# Patient Record
Sex: Male | Born: 1950 | Race: White | Hispanic: No | Marital: Married
Health system: Midwestern US, Community
[De-identification: ages and names within clinical notes are randomized; demographics above are authoritative.]

## PROBLEM LIST (undated history)

## (undated) DIAGNOSIS — D649 Anemia, unspecified: Secondary | ICD-10-CM

## (undated) MED FILL — TAMSULOSIN 0.4 MG CAPSULE: 0.4 0.4 mg | ORAL | 30 days supply | Qty: 30 | Fill #0

## (undated) MED FILL — OXYBUTYNIN CHLORIDE ER 5 MG TABLET,EXTENDED RELEASE 24 HR: 5 5 MG | ORAL | 30 days supply | Qty: 30 | Fill #0

---

## 2009-12-19 LAB — COMPREHENSIVE METABOLIC PANEL
ALT: 26 U/L (ref 10–40)
AST: 19 U/L (ref 15–37)
Albumin/Globulin Ratio: 1.2 (ref 1.1–2.2)
Albumin: 4 g/dL (ref 3.4–5.0)
Alkaline Phosphatase: 158 U/L — ABNORMAL HIGH (ref 45–129)
Anion Gap: 8
BUN: 14 mg/dL (ref 7–18)
CO2: 26 meq/L (ref 21–32)
Calcium: 9.2 mg/dL (ref 8.3–10.6)
Chloride: 101 meq/L (ref 99–110)
Creatinine: 1 mg/dL (ref 0.9–1.3)
GFR Est, African/Amer: >60
GFR, Estimated: >60 (ref >60)
Glucose: 238 mg/dL — ABNORMAL HIGH (ref 70–99)
Potassium: 4.3 meq/L (ref 3.5–5.1)
Sodium: 135 meq/L — ABNORMAL LOW (ref 136–145)
Total Bilirubin: 0.3 mg/dL (ref 0.0–1.0)
Total Protein: 7.3 g/dL (ref 6.4–8.2)

## 2009-12-19 LAB — CBC WITH AUTO DIFFERENTIAL
Basophils %: 0.5 % (ref 0.0–2.0)
Basophils Absolute: 0.1 10*3 (ref 0.0–0.2)
Eosinophils %: 6.4 % — ABNORMAL HIGH (ref 0.0–5.0)
Eosinophils Absolute: 0.9 10*3 — ABNORMAL HIGH (ref 0.0–0.6)
Granulocyte Absolute Count: 8.4 10*3 — ABNORMAL HIGH (ref 1.7–7.7)
Hematocrit: 46.5 % (ref 40.5–52.5)
Hemoglobin: 15.9 g/dL (ref 13.5–17.5)
Lymphocytes %: 27.9 % (ref 25.0–40.0)
Lymphocytes Absolute: 4 10*3 (ref 1.0–5.1)
MCH: 31.7 pg (ref 26–34)
MCHC: 34.1 g/dL (ref 31–36)
MCV: 93 fl (ref 80–100)
MPV: 8.6 fl (ref 5.0–10.5)
Monocytes %: 6.7 %
Monocytes Absolute: 1 10*3
Platelets: 210 10*3 (ref 135–450)
RBC: 5.01 10*6 (ref 4.2–5.9)
RDW: 14.3 % (ref 11.5–14.5)
Segs Relative: 58.5 % (ref 42.0–63.0)
WBC: 14.3 10*3 — ABNORMAL HIGH (ref 4.0–11.0)

## 2009-12-19 LAB — LIPID PANEL
Cholesterol, Total: 129 mg/dl (ref <200)
HDL: 30 mg/dL — ABNORMAL LOW (ref 40–60)
LDL Calculated: 75 mg/dl (ref <100)
Triglycerides: 117 mg/dl (ref <150)
VLDL Cholesterol Calculated: 23 mg/dl

## 2009-12-20 LAB — HEMOGLOBIN A1C
A1c: 9.9 — ABNORMAL HIGH (ref 4.0–6.0)
eAG: 237.4 mg/dl

## 2015-06-22 ENCOUNTER — Ambulatory Visit: Admit: 2015-06-22 | Discharge: 2015-06-22 | Payer: PRIVATE HEALTH INSURANCE

## 2015-06-22 DIAGNOSIS — N35012 Post-traumatic membranous urethral stricture: Secondary | ICD-10-CM

## 2015-06-22 MED ORDER — ciprofloxacin HCl (CIPRO) 500 MG tablet
500 | ORAL_TABLET | Freq: Two times a day (BID) | ORAL | Status: AC
Start: 2015-06-22 — End: 2016-01-03

## 2015-06-22 NOTE — Unmapped (Signed)
Chief Complaint   Patient presents with   ??? 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 Patient with several year history of LUTS. PCP treated for BPH however symptoms persisted. Then referred to [facility]rology who did a cysto and noted urethral stricture. Patient SPT for 6 months. Has it changed monthly.   Has diabetes and COPD.  Smokes 1 pack per day which is down from 4 packs per day for many years.        Review of Systems   Constitutional: Negative for fever, chills and fatigue.   Genitourinary: Positive for difficulty urinating.       Allergies  Review of patient's allergies indicates no known allergies.    Medications  Outpatient Encounter Prescriptions as of 06/22/2015   Medication Sig Dispense Refill   ??? albuterol (PROVENTIL;VENTOLIN;PROAIR) 90 mcg/actuation inhaler Inhale 2 puffs into the lungs every 6 hours as needed for Wheezing.     ??? cholecalciferol, vitamin D3, 1000 units tablet Take 1,000 Units by mouth daily.     ??? docusate sodium (COLACE) 100 MG capsule Take 100 mg by mouth 2 times a day.     ??? DULoxetine (CYMBALTA) 60 MG capsule Take 60 mg by mouth daily.     ??? ergocalciferol (VITAMIN D2) 50,000 unit capsule Take 50,000 Units by mouth once a week.     ??? insulin aspart (NOVOLOG) 100 unit/mL injection Inject subcutaneously 3 times a day with meals.     ??? insulin glargine (LANTUS) 100 unit/mL injection Inject subcutaneously at bedtime.     ??? liraglutide (VICTOZA 3-PAK) 0.6 mg/0.1 mL (18 mg/3 mL) PnIj Inject subcutaneously.     ??? lisinopril-hydrochlorothiazide (PRINZIDE,ZESTORETIC) 20-12.5 mg per tablet Take 1 tablet by mouth daily.     ??? loratadine (CLARITIN) 10 mg tablet Take 10 mg by mouth daily.     ??? metFORMIN (GLUCOPHAGE) 1000 MG tablet Take 1,000 mg by mouth 2 times a day with meals.     ??? mirtazapine (REMERON) 30 MG tablet Take 30 mg by mouth at bedtime.     ??? ranitidine (ZANTAC) 150 MG tablet  Take 150 mg by mouth 2 times a day.     ??? roPINIRole (REQUIP) 1 MG tablet Take 1 mg by mouth 3 times a day.     ??? rosuvastatin (CRESTOR) 40 MG tablet Take 40 mg by mouth daily.     ??? sildenafil (VIAGRA) 100 MG tablet Take 100 mg by mouth daily as needed for Erectile Dysfunction.     ??? tiotropium (SPIRIVA) 18 mcg Inhale 18 mcg into the lungs daily.     ??? traZODone (DESYREL) 100 MG tablet Take 100 mg by mouth at bedtime.     ??? zolpidem (AMBIEN) 10 mg tablet Take 10 mg by mouth at bedtime as needed for Sleep.       No facility-administered encounter medications on file as of 06/22/2015.        Histories  He has a past medical history of Urinary retention; COPD (chronic obstructive pulmonary disease); Diabetes mellitus; Hypertension; and FH: allergy.    He has past surgical history that includes Hernia repair; Tendon repair; and Rotator cuff repair.    His family history is not on file.    He reports that he has been smoking.  He does not have any smokeless tobacco history on file. He reports  that he does not drink alcohol or use illicit drugs.    The following portions of the patient's history were reviewed and updated as appropriate: allergies, current medications, past family history, past medical history, past social history, past surgical history and problem list.    Blood pressure 140/72, pulse 79, temperature 97.1 ??F (36.2 ??C), temperature source Oral, resp. rate 16, height 6' 2 (1.88 m), weight 234 lb (106.142 kg).  Physical Exam   Constitutional: He is oriented to person, place, and time. He appears well-developed and well-nourished.   HENT:   Head: Normocephalic and atraumatic.   Eyes: Conjunctivae and EOM are normal.   Neck: Normal range of motion. Neck supple.   Pulmonary/Chest: Effort normal. No respiratory distress.   Musculoskeletal: Normal range of motion.   Neurological: He is alert and oriented to person, place, and time.   Skin: Skin is warm and dry.   Psychiatric: He has a normal mood and affect. His  behavior is normal.            Assessment  ??Urethral stricture: No imaging available  Urinary retention managed with SPT x 7 months  Reports hx of sneezing with full bladder 30 years ago and seeing blood afterwards. Had cysto and was told small tear in the bladder    Plan  Cystoscopy, retrograde urethrogram, antegrade urethrogram   If small bulbar stricture, can perform one time dilation  If long stricture will need to discuss formal recontruction       Medical Decision Making  The following items were considered in medical decision making:  Review / order other diagnostic tests/interventions     I performed a history and physical exam of the patient and discussed patient management with the fellow I reviewed the fellow's note and agree with the documented findings and plan of care.

## 2015-07-19 ENCOUNTER — Ambulatory Visit: Admit: 2015-07-19 | Payer: PRIVATE HEALTH INSURANCE | Attending: Family

## 2015-07-19 DIAGNOSIS — N359 Urethral stricture, unspecified: Secondary | ICD-10-CM

## 2015-07-19 LAB — BASIC METABOLIC PANEL
Anion Gap: 7 mmol/L (ref 3–16)
BUN: 24 mg/dL (ref 7–25)
CO2: 28 mmol/L (ref 21–33)
Calcium: 8.9 mg/dL (ref 8.6–10.3)
Chloride: 103 mmol/L (ref 98–110)
Creatinine: 0.74 mg/dL (ref 0.60–1.30)
Glucose: 223 mg/dL — ABNORMAL HIGH (ref 70–100)
Osmolality, Calculated: 297 mosm/kg (ref 278–305)
Potassium: 4 mmol/L (ref 3.5–5.3)
Sodium: 138 mmol/L (ref 133–146)
eGFR AA CKD-EPI: >90 See note.
eGFR NONAA CKD-EPI: >90 See note.

## 2015-07-19 LAB — CBC
Hematocrit: 45.5 % (ref 38.5–50.0)
Hemoglobin: 15.2 g/dL (ref 13.2–17.1)
MCH: 30 pg (ref 27.0–33.0)
MCHC: 33.3 g/dL (ref 32.0–36.0)
MCV: 90.1 fL (ref 80.0–100.0)
MPV: 8.7 fL (ref 7.5–11.5)
Platelets: 216 10*3/uL (ref 140–400)
RBC: 5.06 10*6/uL (ref 4.20–5.80)
RDW: 14.3 % (ref 11.0–15.0)
WBC: 12 10*3/uL (ref 3.8–10.8)

## 2015-07-19 NOTE — Unmapped (Signed)
ANESTHESIOLOGY CONSULTATION AND PRE-OPERATIVE HISTORY AND PHYSICAL       Subjective:      [facility] Attending Physician: Marciano Sequin, MD  [facility] NP / PA:   Sharen Heck, CNP    Date of Surgery:  08/01/2015  Surgeon:  Dr. Ree Shay   Diagnosis:  Urethral stricture  Procedure:  Cystoscopy, retrograde urethrogram, antegrade urethrogram, possible urethral dilation    Patient ID: Garrett Reynolds is a 64 y.o. male.    Patient is being seen today at the request of Dr. Ree Shay to render an opinion on perioperative risk optimization and to coordinate medical care as necessary prior to the following procedure:  Cystoscopy, retrograde urethrogram, antegrade urethrogram, possible urethral dilation.    Chief Complaint   Patient presents with   ??? Pre-op Exam     urethral stricture        History of Present Illness:    64 yo male with a several year history of LUTS. PCP treated for BPH but symptoms persisted. A cystoscopy was performed at the [facility] revealing a urethral stricture. His urinary retention is managed with a suprapubic catheter. He denies pain, rating pain 0/10 in severity. Denies aggravating or alleviating factors. Denies associated symptoms. He is now scheduled for cystoscopy, retrograde urethrogram, antegrade urethrogram, possible urethral dilation    Chronic Medical Conditions, Severity, Optimization:      Duke Activity Scale:  3 - Walking on a flat surface for one or two blocks.           Medical History:     Past Medical History   Diagnosis Date   ??? Urinary retention    ??? COPD (chronic obstructive pulmonary disease)    ??? Diabetes mellitus    ??? Hypertension    ??? FH: allergy    ??? High cholesterol    ??? Stroke    ??? GERD (gastroesophageal reflux disease)        Surgical History:     Past Surgical History   Procedure Laterality Date   ??? Hernia repair     ??? Tendon repair     ??? Rotator cuff repair         Family History:   History reviewed. No pertinent family history.    Social History:     Social History     Social History   ??? Marital  Status: Married     Spouse Name: N/A   ??? Number of Children: N/A   ??? Years of Education: N/A     Occupational History   ??? Not on file.     Social History Main Topics   ??? Smoking status: Current Every Day Smoker -- 1.00 packs/day     Types: Cigarettes   ??? Smokeless tobacco: Not on file   ??? Alcohol Use: Yes      Comment: rare   ??? Drug Use: No   ??? Sexual Activity: Not on file     Other Topics Concern   ??? Caffeine Use Yes   ??? Exercise No   ??? Seat Belt Yes     Social History Narrative       Allergies:   No Known Allergies    Medications:     Prior to Admission medications taking for visit date 07/19/15   Medication Sig Taking? Authorizing Provider   albuterol (PROVENTIL;VENTOLIN;PROAIR) 90 mcg/actuation inhaler Inhale 2 puffs into the lungs every 6 hours as needed for Wheezing. Yes Historical Provider, MD   cholecalciferol, vitamin D3, 1000 units tablet Take  1,000 Units by mouth daily. Yes Historical Provider, MD   docusate sodium (COLACE) 100 MG capsule Take 100 mg by mouth 2 times a day. Yes Historical Provider, MD   DULoxetine (CYMBALTA) 60 MG capsule Take 60 mg by mouth daily. Yes Historical Provider, MD   insulin aspart (NOVOLOG) 100 unit/mL injection Inject subcutaneously 2 times a day.    Yes Historical Provider, MD   insulin glargine (LANTUS) 100 unit/mL injection Inject subcutaneously at bedtime. Yes Historical Provider, MD   liraglutide (VICTOZA 3-PAK) 0.6 mg/0.1 mL (18 mg/3 mL) PnIj Inject subcutaneously. Yes Historical Provider, MD   lisinopril-hydrochlorothiazide (PRINZIDE,ZESTORETIC) 20-12.5 mg per tablet Take 1 tablet by mouth daily. Yes Historical Provider, MD   loratadine (CLARITIN) 10 mg tablet Take 10 mg by mouth daily. Yes Historical Provider, MD   metFORMIN (GLUCOPHAGE) 1000 MG tablet Take 1,000 mg by mouth 2 times a day with meals. Yes Historical Provider, MD   mirtazapine (REMERON) 30 MG tablet Take 30 mg by mouth at bedtime. Yes Historical Provider, MD   ranitidine (ZANTAC) 150 MG tablet Take 150  mg by mouth 2 times a day. Yes Historical Provider, MD   roPINIRole (REQUIP) 1 MG tablet Take 1 mg by mouth 3 times a day. Yes Historical Provider, MD   rosuvastatin (CRESTOR) 40 MG tablet Take 40 mg by mouth daily. Yes Historical Provider, MD   sildenafil (VIAGRA) 100 MG tablet Take 100 mg by mouth daily as needed for Erectile Dysfunction. Yes Historical Provider, MD   tiotropium (SPIRIVA) 18 mcg Inhale 18 mcg into the lungs daily. Yes Historical Provider, MD   traZODone (DESYREL) 100 MG tablet Take 100 mg by mouth at bedtime. Yes Historical Provider, MD   zolpidem (AMBIEN) 10 mg tablet Take 10 mg by mouth at bedtime as needed for Sleep. Yes Historical Provider, MD   ciprofloxacin HCl (CIPRO) 500 MG tablet Take 1 tablet (500 mg total) by mouth 2 times a day. Start 7 days prior to surgery  [place]   ergocalciferol (VITAMIN D2) 50,000 unit capsule Take 50,000 Units by mouth once a week.  Historical Provider, MD          Review of Systems   Constitutional: Negative for fever, chills, diaphoresis, activity change, appetite change and fatigue.   HENT: Negative for ear pain, hearing loss, sore throat, tinnitus and trouble swallowing.    Eyes: Positive for visual disturbance. Negative for pain, discharge, redness and itching.   Respiratory: Positive for cough, shortness of breath and wheezing. Negative for apnea, choking and stridor.         SOB only when doing yard work   Cardiovascular: Negative for chest pain, palpitations and leg swelling.        Denies orthopnea   Gastrointestinal: Positive for heartburn. Negative for nausea, vomiting, abdominal pain, diarrhea, constipation and abdominal distention.   Genitourinary:        Suprapubic catheter    Musculoskeletal: Positive for gait problem. Negative for back pain, joint swelling, neck pain and neck stiffness.        Bilateral ankle pain   Skin: Negative for color change, pallor, rash and wound.   Neurological: Negative for dizziness, tremors, seizures,  syncope, facial asymmetry, speech difficulty, weakness, light-headedness, numbness and headaches.   Hematological: Does not bruise/bleed easily.   Psychiatric/Behavioral: Positive for depression and sleep disturbance. Negative for suicidal ideas, confusion and self-injury. The patient is nervous/anxious.        Objective:   Blood pressure 110/64, pulse  94, temperature 98.3 ??F (36.8 ??C), temperature source Oral, height 6' 2 (1.88 m), weight 235 lb (106.595 kg), SpO2 94 %.    Physical Exam   Constitutional: He is oriented to person, place, and time. He appears well-developed and well-nourished. No distress.   HENT:   Head: Normocephalic and atraumatic.   Right Ear: Hearing and external ear normal.   Left Ear: Hearing and external ear normal.   Nose: Nose normal.   Mouth/Throat: Uvula is midline, oropharynx is clear and moist and mucous membranes are normal.   Full upper dental extractions, multiple missing lower teeth   Eyes: Conjunctivae, EOM and lids are normal. Pupils are equal, round, and reactive to light. No scleral icterus.   Neck: Normal range of motion. Neck supple. No JVD present. Carotid bruit is not present. No tracheal deviation present. No thyroid mass and no thyromegaly present.   Cardiovascular: Normal rate, regular rhythm, S1 normal, S2 normal and normal heart sounds.  Exam reveals no gallop and no friction rub.    No murmur heard.  Pulses:       Carotid pulses are 2+ on the right side, and 2+ on the left side.       Radial pulses are 2+ on the right side, and 2+ on the left side.        Dorsalis pedis pulses are 2+ on the right side, and 2+ on the left side.   Pulmonary/Chest: Effort normal. No stridor. No apnea. No respiratory distress. He has decreased breath sounds. He has wheezes in the right lower field and the left lower field. He has no rhonchi. He has no rales. He exhibits no tenderness.   Abdominal: Soft. Bowel sounds are normal. He exhibits no distension and no mass. There is no  tenderness. There is no rebound and no guarding.       Musculoskeletal: Normal range of motion. He exhibits edema. He exhibits no tenderness.   Trace edema   Lymphadenopathy:     He has no cervical adenopathy.   Neurological: He is alert and oriented to person, place, and time. He has normal strength. He displays no tremor. No cranial nerve deficit or sensory deficit. He exhibits normal muscle tone. Coordination and gait normal.   Skin: Skin is warm and dry. No petechiae, no purpura and no rash noted. He is not diaphoretic. No cyanosis or erythema. No pallor. Nails show no clubbing.   Psychiatric: He has a normal mood and affect. His speech is normal and behavior is normal. Judgment and thought content normal.       Airway:  Mallampati I (soft palate, uvula, fauces, and tonsillar pillars visible), Thyromental distance 3 finger breadths, opening 3 finger breadths. Full upper dental extraction, multiple missing lower teeth. Denies loose teeth.  Full neck ROM. Large amount of facial hair.        Lab Review:   No results found for: WBC, HGB, HCT, MCH, PLT, GLUCOSE, CREATININE, NA, K, CL, CO2, PREALBUMIN, GFRNONAFRAM, LDH, BILITOT, PROT, AST, ALT, ALKPHOS, CHOLTOT, LDL, HDL, TRIG, PROTIME      Study Results:     ECHO 2008  Normal LV function  Bubble study normal without shunt  No regional wall motion abnormalities    Stress 2007  Normal study  Negative for ischemia    ASA Physical Status:   ASA Physical Status:  3       Assessment and Recommendations:   64 yo male with a urethral stricture for cystoscopy, retrograde urethrogram,  antegrade urethrogram, possible urethral dilation under GA.    Concurrent medical issues include:    1. Cardiac risk/Functional status: No known CAD or CHF. Stress test from 2007 was negative for ischemia and ECHO from 2008 showed a normal EF and no regional wall motion abnormalities. Denies chest pain. Denies orthopnea or edema. SOB only when doing yard work or walking at a fast pace. He is able  to climb a flight of stairs slowly and walked around the hospital today without chest pain or SOB. Duke Activity 3. Will not require any further workup for this lower risk surgery.    2. Hypertension: BP today 110/64.  Taking lisinopril-HCTZ.  Recommended to hold AM of OR.     3. HLD: taking rosuvastatin.  Recommended to continue perioperatively.     4. Diabetes Type II: Hgb A1C 6.7 from labs on 04/2015. Average fasting glucose 110. Taking metformin. He is also taking victoza, novolog sliding scale BID, and lantus BID.  Instructed to half lantus dose night prior to surgery. He will hold his metformin, victoza, novolog, and lantus AM of OR.     5. GERD: controlled with zantac.  Recommended to take AM of OR.     6. COPD: controlled with multiple inhalers. Denies need for supplemental Oxygen or recent exacerbation.  Managed by his PCP. C/o chronic cough and wheezing. SpO2 95% on room air. Recommended to use and bring inhalers AM of OR.     7. Depression: taking cymbalta and trazodone. Recommended to continue perioperatively.    8. TIA: occurred in 2008. Denies residual weakness or memory loss.     9. Tobacco Use: he is down from 4 packs daily to 1. Smoking cessation discussed but he is not ready to quit. C/o chronic cough and wheezing.     10. CKD Stage III: Cr. 1.37 and GFR 52 from labs in 02/2015.     Preoperative instructions reviewed with patient. Patient verbalizes understanding.  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  Unsure of exact cause, states  They told me I quit breathing. Has had surgical procedures since with no complications. Airway does not predict difficult management. Peripheral veins appear adequate for access.    Anesthetic Type: Recommend GA  Analgesia Type: Multimodal analgesia including IV opiates and other IV/PO analgesics   Venous Access: Adequate peripheral IVs     Monitors: Standard ASA monitors  Postoperative disposition: PACU    Discussed with patient the risks, benefits, and potential complications associated with anesthesia including, but not limited to, stroke, MI, and death. Patient verbalized understanding and agreed to the plan as outlined above.    Ester Rink, MD  PGY-2 Anesthesiology    ATTENDING ANESTHESIOLOGIST ADDENDUM:  Sharen Heck CNP, Ester Rink, MD and I interviewed and examined Garrett Reynolds and discussed their care.  I reviewed, confirmed, and amended the above note and agree with the findings and plan of care as noted.        I have conveyed my recommendations for perioperative management of this patient???s medical problems through the shared electronic medical record.    Marciano Sequin, M.D.

## 2015-07-19 NOTE — Unmapped (Addendum)
Pre-Procedure Instructions    We???re pleased that you have chosen [facility] for your upcoming procedure.  The staff serving you is professionally trained to provide the highest quality care.  We encourage you to ask questions and to let the staff know your special needs.  We want your visit to be as comfortable as possible.    Your procedure is scheduled on 08/01/2015 at 1130 AM.  Please arrive at 930 AM at: [facility] on the second floor of the main hospital     ??? DO NOT EAT OR DRINK ANYTHING (including gum, mints, water, etc.) after midnight the night before your procedure.  You may brush your teeth and gargle on the morning of surgery, but do not swallow any water.  You may take the following medications with a small sip of water:    1. Please use and bring inhalers  ??? Please make transportation arrangements and bring a responsible adult to accompany you home and remain with you for 24 hours.  ??? Leave valuables (money, jewelry, credit cards) at home.  If you wear glasses or contacts, bring a case for safekeeping.  ??? Wear casual, loose fitting, and comfortable clothing.  A gown will be provided.  If you are staying overnight, bring a small overnight bag.  (Storage space is limited.)  ??? Please remove all makeup, jewelry, body piercings, powder, perfume, and nail polish before you arrive.  ??? Bring a list of your medications and dose including herbal and over-the-counter medications.  Do not bring any pills or medications to the hospital. (Exception: transplant patients.)  ??? Bring a photo ID and your insurance card so we can bill your insurance company directly.  ??? Please do not bring any children under the age of 73 to the hospital.  ??? Do not shave in the area of the surgery for 2 days prior to surgery.  If needed, a trained staff member will clip the area immediately before your surgery.  ??? Quit smoking as far in advance of surgery as possible.  Patients who quit at least 30 days before  surgery may have better outcomes.  ??? If you are diabetic, pay close attention to your blood sugar and try to keep it in the range your doctor wants it to be in.  If you have low blood sugar prior to coming in to the hospital you may drink a small amount of CLEAR SUGAR-CONTAINING FLUIDS WITHOUT PULP SUCH AS APPLE JUICE OR CLEAR SODA.  Depending on the procedure you are having this could impact the timing of your surgery.  ??? Discuss discontinuing herbal medications with your doctor before surgery.  ??? Please shower at home the evening before and the morning of surgery using an antiseptic agent, if provided, or antibacterial soap.  ??? If you suspect that you have an infection prior to surgery, contact your doctor and surgeon prior to surgery.  Also tell the pre-surgery nurse on the day of surgery.    Additional instructions: No aleve, naproxen, advil, meloxicam, voltaren, or ibuprofen 7 days prior to surgery. Please stop supplements 7 days prior to surgery.     Please half your lantus dose night prior to surgery.    Contact information:  [facility], Monday - Friday 8:30 am - 5:00 pm   [phone]  [facility], - Friday 8:30 am - 5:00 pm   [phone]

## 2015-07-31 MED FILL — CEFAZOLIN 2 GRAM/50 ML IN DEXTROSE (ISO-OSMOTIC) INTRAVENOUS PIGGYBACK: 2 2 gram/50 mL | INTRAVENOUS | Qty: 50

## 2015-08-01 ENCOUNTER — Ambulatory Visit: Admit: 2015-08-01 | Payer: PRIVATE HEALTH INSURANCE

## 2015-08-01 LAB — POC GLU MONITORING DEVICE
POC Glucose Monitoring Device: 175 mg/dL (ref 70–100)
POC Glucose Monitoring Device: 140 mg/dL — ABNORMAL HIGH (ref 70–100)

## 2015-08-01 MED ORDER — ipratropium-albuterol (DUO-NEB) 0.5 mg-3 mg(2.5 mg base)/3 mL nebulizer solution 3 mL
0.5 | Freq: Once | RESPIRATORY_TRACT | Status: AC
Start: 2015-08-01 — End: 2015-08-01
  Administered 2015-08-01: 14:00:00 3 mL via RESPIRATORY_TRACT

## 2015-08-01 MED ORDER — propofol 10 mg/ml (DIPRIVAN) INFUSION 200 mg
10 | INTRAVENOUS | Status: AC | PRN
Start: 2015-08-01 — End: 2015-08-01
  Administered 2015-08-01: 15:00:00 150 mg via INTRAVENOUS
  Administered 2015-08-01: 15:00:00 80 mg via INTRAVENOUS

## 2015-08-01 MED ORDER — lidocaine (PF) 20 mg/mL (2 %) Soln
20 | INTRAVENOUS | Status: AC | PRN
Start: 2015-08-01 — End: 2015-08-01
  Administered 2015-08-01: 15:00:00 80 via INTRAVENOUS

## 2015-08-01 MED ORDER — sodium chloride, irrigation 0.9 % irrigation
0.9 | Status: AC | PRN
Start: 2015-08-01 — End: 2015-08-01
  Administered 2015-08-01: 15:00:00 1500

## 2015-08-01 MED ORDER — peppermint oil liquid 1 mL
Status: AC | PRN
Start: 2015-08-01 — End: 2015-08-01

## 2015-08-01 MED ORDER — promethazine (PHENERGAN) injection 6.25 mg
25 | Freq: Four times a day (QID) | INTRAMUSCULAR | Status: AC | PRN
Start: 2015-08-01 — End: 2015-08-01

## 2015-08-01 MED ORDER — nalOXone (NARCAN) injection 0.04 mg
0.4 | INTRAMUSCULAR | Status: AC | PRN
Start: 2015-08-01 — End: 2015-08-01

## 2015-08-01 MED ORDER — OMNIPAQUE (iohexol) 180 mg iodine/mL Soln
180 | INTRATHECAL | Status: AC | PRN
Start: 2015-08-01 — End: 2015-08-01
  Administered 2015-08-01: 15:00:00 15 via INTRATHECAL

## 2015-08-01 MED ORDER — ondansetron (ZOFRAN) 4 mg/2 mL injection
4 | INTRAMUSCULAR | Status: AC | PRN
Start: 2015-08-01 — End: 2015-08-01
  Administered 2015-08-01: 15:00:00 4 via INTRAVENOUS

## 2015-08-01 MED ORDER — oxyCODONE (ROXICODONE) 5 MG immediate release tablet
5 | ORAL_TABLET | ORAL | 0.00 refills | 6.00000 days | Status: AC | PRN
Start: 2015-08-01 — End: 2016-01-03

## 2015-08-01 MED ORDER — HYDROmorphone (DILAUDID) injection Syrg 0.4 mg
1 | INTRAMUSCULAR | Status: AC | PRN
Start: 2015-08-01 — End: 2015-08-01

## 2015-08-01 MED ORDER — propofol 10 mg/ml (DIPRIVAN) injection
10 | INTRAVENOUS | Status: AC | PRN
Start: 2015-08-01 — End: 2015-08-01
  Administered 2015-08-01: 15:00:00 200 via INTRAVENOUS

## 2015-08-01 MED ORDER — fentaNYL (SUBLIMAZE) injection 12.5 mcg
50 | INTRAMUSCULAR | Status: AC | PRN
Start: 2015-08-01 — End: 2015-08-01

## 2015-08-01 MED ORDER — HYDROmorphone (DILAUDID) injection Syrg 0.2 mg
1 | INTRAMUSCULAR | Status: AC | PRN
Start: 2015-08-01 — End: 2015-08-01

## 2015-08-01 MED ORDER — lactatedringersinfusion
INTRAVENOUS | Status: AC | PRN
Start: 2015-08-01 — End: 2015-08-01
  Administered 2015-08-01: 15:00:00 via INTRAVENOUS

## 2015-08-01 MED ORDER — ipratropium-albuterol (DUO-NEB) 0.5 mg-3 mg(2.5 mg base)/3 mL nebulizer solution 3 mL
0.5 | Freq: Once | RESPIRATORY_TRACT | Status: AC
Start: 2015-08-01 — End: 2015-08-01

## 2015-08-01 MED ORDER — fentaNYL (SUBLIMAZE) injection
50 | INTRAMUSCULAR | Status: AC | PRN
Start: 2015-08-01 — End: 2015-08-01
  Administered 2015-08-01 (×2): 50 via INTRAVENOUS

## 2015-08-01 MED ORDER — fentaNYL (SUBLIMAZE) injection 25 mcg
50 | INTRAMUSCULAR | Status: AC | PRN
Start: 2015-08-01 — End: 2015-08-01

## 2015-08-01 MED ORDER — polyethylene glycol (GLYCOLAX) 17 gram/dose powder
17 | Freq: Two times a day (BID) | ORAL | 0.00 refills | 14.00000 days | Status: AC | PRN
Start: 2015-08-01 — End: ?

## 2015-08-01 MED ORDER — fentaNYL (SUBLIMAZE) injection 50 mcg
50 | INTRAMUSCULAR | Status: AC | PRN
Start: 2015-08-01 — End: 2015-08-01

## 2015-08-01 MED ORDER — oxyCODONE (ROXICODONE) immediate release tablet 5 mg
5 | ORAL | Status: AC | PRN
Start: 2015-08-01 — End: 2015-08-01

## 2015-08-01 MED ORDER — ondansetron (ZOFRAN) 4 mg/2 mL injection 4 mg
4 | Freq: Four times a day (QID) | INTRAMUSCULAR | Status: AC | PRN
Start: 2015-08-01 — End: 2015-08-01

## 2015-08-01 MED ORDER — ceFAZolin (ANCEF) IVPB 2 g in D5W (duplex)
2 | INTRAVENOUS | Status: AC | PRN
Start: 2015-08-01 — End: 2015-08-01
  Administered 2015-08-01: 15:00:00 2 g via INTRAVENOUS

## 2015-08-01 MED ORDER — HYDROmorphone (DILAUDID) injection Syrg 0.6 mg
1 | INTRAMUSCULAR | Status: AC | PRN
Start: 2015-08-01 — End: 2015-08-01

## 2015-08-01 MED ORDER — phenylephrine (NEO-SYNEPHRINE) injection
10 | INTRAMUSCULAR | Status: AC | PRN
Start: 2015-08-01 — End: 2015-08-01
  Administered 2015-08-01 (×4): 100 via INTRAVENOUS

## 2015-08-01 MED ORDER — oxyCODONE (ROXICODONE) immediate release tablet 10 mg
5 | ORAL | Status: AC | PRN
Start: 2015-08-01 — End: 2015-08-01
  Administered 2015-08-01: 16:00:00 10 mg via ORAL

## 2015-08-01 MED ORDER — oxyCODONE-acetaminophen (PERCOCET) 5-325 mg per tablet 2 tablet
5-325 | Freq: Once | ORAL | Status: AC
Start: 2015-08-01 — End: 2015-08-01
  Administered 2015-08-01: 14:00:00 2 via ORAL

## 2015-08-01 MED FILL — IPRATROPIUM 0.5 MG-ALBUTEROL 3 MG (2.5 MG BASE)/3 ML NEBULIZATION SOLN: 0.5 0.5 mg-3 mg(2.5 mg base)/3 mL | RESPIRATORY_TRACT | Qty: 3

## 2015-08-01 MED FILL — CEFAZOLIN 2 GRAM/50 ML IN DEXTROSE (ISO-OSMOTIC) INTRAVENOUS PIGGYBACK: 2 2 gram/50 mL | INTRAVENOUS | Qty: 50

## 2015-08-01 MED FILL — OXYCODONE 5 MG TABLET: 5 5 MG | ORAL | Qty: 2

## 2015-08-01 MED FILL — OXYCODONE-ACETAMINOPHEN 5 MG-325 MG TABLET: 5-325 5-325 mg | ORAL | Qty: 2

## 2015-08-01 NOTE — Unmapped (Signed)
Patient alert and oriented x4, states pain is controlled and denies nausea. Vital signs WNL, tolerating PO fluids and crackers. Patient urinated 150 ml per urinal with no difficulties. Discharge instructions reviewed with patient and spouse, prescriptions given. Peripheral IV removed.

## 2015-08-01 NOTE — Unmapped (Signed)
Anesthesia Post Note    Patient: Garrett Reynolds    Procedure(s) Performed: Procedure(s):  CYSTOSCOPY, RETROGRADE URETHROGRAM, ANTEGRADE URETHROGRAM, POSSIBLE URETHRAL DILATION    Anesthesia type: general    Patient location: PACU    Post pain: Adequate analgesia    Post assessment: no apparent anesthetic complications and tolerated procedure well    Last Vitals:   Filed Vitals:    08/01/15 1214   BP: 128/65   Pulse: 80   Temp: 97.5 ??F (36.4 ??C)   Resp: 14   SpO2: 98%       Post vital signs: stable    Level of consciousness: awake, alert  and oriented    Complications: None

## 2015-08-01 NOTE — Unmapped (Signed)
Anesthesia Transfer of Care Note    Patient: Garrett Reynolds  Procedure(s) Performed: Procedure(s):  CYSTOSCOPY, RETROGRADE URETHROGRAM, ANTEGRADE URETHROGRAM, POSSIBLE URETHRAL DILATION    Patient location: PACU    Post pain: Adequate analgesia    Post assessment: no apparent anesthetic complications and tolerated procedure well    Post vital signs:    Filed Vitals:    08/01/15 1136   BP: 130/65   Pulse: 85   Temp: 97.8 ??F (36.6 ??C)   Resp: 16   SpO2: 99%       Level of consciousness: awake and alert     Complications: None

## 2015-08-01 NOTE — Unmapped (Signed)
CYSTOSCOPY, RETROGRADE URETHROGRAM, ANTEGRADE URETHROGRAM, POSSIBLE URETHRAL DILATION  Procedure Note    Geoffery Aultman  08/01/2015      Pre-op Diagnosis: Urethral stricture, unspecified location, unspecified stricture type [N35.9]       Post-op Diagnosis: Bulbar urethral stricture, 1 cm    Procedure(s):  1. Cystoscopy  2. Antegrade urethrogram  3. Retrograde urethrogram  4. Dilation of bulbar urethral stricture, using Filiforms/follower, to 24 F stricture        Surgeon(s):  Tana Felts, MD PHD    Anesthesia: General    Staff:   Circulator: Lequita Halt, RN  Scrub Person: Bayard Hugger, CST  Resident: Payton Mccallum, MD; Onalee Hua, MD    Estimated Blood Loss: Minimal                 Specimens: * No specimens in log *           Drains:             There were no complications unless listed below.  Patient will be discharged from OR with suprapubic tube that is capped.  Will follow up on Friday with Maryjean Ka, post-op check and void trial, and at that time if low PVR will discontinue SPT.  Patient will then need a repeat cystoscopy, antegrade/retrograde urethrogram in 3 months.      Karlena Luebke Myra Rude     Date: 08/01/2015  Time: 11:26 AM

## 2015-08-01 NOTE — Unmapped (Signed)
OPERATIVE REPORT    Date of Operation: 08/01/2015    Preoperative Diagnosis:   1. Urethral stricture, unspecified location    Postoperative Diagnosis:   1. 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 Cystoscopy  2. Antegrade urethrogram  3. Retrograde urethrogram  4. Dilation of bulbar urethral stricture with filiforms and followers, to 24 F     Surgeon:   Tana Felts, MD PHD    Assistants:   Onalee Hua, MD  Payton Mccallum, MD    Anesthesia: General LMA anesthesia    Indications: Garrett Reynolds is a 64 y.o. male with a urethral stricture who was transferred from [place]. Informed consent was obtained and the risks, benefits, and details of the procedure were explained to the patient who elected to proceed.    Details of Procedure:  The patient was brought to the operating room and placed in the dorsal lithotomy position on the operating room table. SCDs were placed on the lower extremities. Following induction of general anesthesia the patient was intubated without issue. The patient's suprapubic tube and then he was prepped and draped in the usual sterile fashion. A routine timeout was performed, confirming the patient, procedure, site, risk of fire, patient allergies and confirming that preoperative antibiotics had been administered prior to incision.      Next we performed a retrograde urethrogram by instilling approximately 30 cc diluted contrast into the urethra.  A small amount of contrast made it to the bladder but there was obvious narrowing at the proximal bulbar urethra.  Then we passed a cystoscope through the suprapubic tube tract and into the bladder.  After identifying the bladder neck, we advance the scope through the prostatic urethra to the level of the verumontanum.  Contrast was then injected simultaneously through the cystoscope from antegrade and retrograde via a catheter tip syringe.  There was an approximately 1 cm segment of bulbar urethral stricture.      Next we  proceeded to dilate the stricture.  Using filiforms and followers and beginning at 58 French, we then dilated the stricture to a size of 24 F.  After the dilation a picture was taken of the dilated urethra using the cystoscope, and the picture is below at the end of the op note.    We then replaced a new suprapubic catheter and instilled it with 10 cc of water.     At the end of the procedure all needle, lap, instrument and sponge counts were correct. The patient tolerated the procedure well, was extubated without issue in the operating room, and was transported to the PACU in stable condition.    Dr. Tana Felts, MD PHD, the attending surgeon, was present for the critical parts of the procedure and directed all clinical decision making.    Findings:   1. There was a proximal bulbar urethral stricture measuring approximately 1 cm. This was dilated to 40 F using filiforms and followers.     Complications: None     Estimated Blood Loss: Minimal    Drains: 16 F suprapubic tube          Specimens: None    Disposition:     Patient will be discharged from OR with suprapubic tube that is capped.  Will follow up on Friday with Garrett Reynolds, post-op check and void trial, and at that time if low PVR will discontinue SPT.  Patient will then need a repeat office cystoscopy in 3 months.  Garrett Myra Rude, MD    I was present for the entire procedure and findings were discussed and agreed with resident as documented above.  Garrett Reynolds

## 2015-08-01 NOTE — Unmapped (Addendum)
Activity:    Activity as tolerated, no limitations, walk or exercise daily  Your suprapubic tube is capped, and this will give you a chance to void normally through your urethra.  If you are unable to urinate, please uncap your suprapubic tube.    Return to work:   May return to work as tolerated    Diet:   Resume home diet, drink plenty of water    Other Instructions:   Take medications as prescribed. No driving while taking narcotic pain medications.    Some blood in urine can be expected after procedure    Go to the ER or call the Urology Resident on call at [phone] if you experience any of the following:  - Worsening pain, nausea, or vomiting not relieved by medications  - Temperature greater than 101.5 F or fever/chills  - Chest pain, shortness of breath, persistent dizziness, swelling in one or both legs  - Inability to urinate    Follow up:  1. Will have follow up on Friday with Garrett Reynolds for a void trial.  If you are voiding well at that time, will discontinue suprapubic tube.  2. Will have repeat cystoscopy and retrograde/antegrade pyelogram in 3 months.    Sedation or General Anesthesia, Adult  Care After  Refer to this sheet in the next 24 hours. These instructions provide you with information on caring for yourself after your procedure. Your caregiver may also give you more specific instructions. Your treatment has been planned according to current medical practices, but problems sometimes occur. Call your caregiver if you have any problems or questions after your procedure.   HOME CARE INSTRUCTIONS   ?? Do not participate in any activities that require you to be alert or coordinated. Do not:  ?? Drive.  ?? Swim.  ?? Ride a bicycle.  ?? Operate heavy machinery.  ?? Cook.  ?? Use power tools.  ?? Climb ladders.  ?? Work at [employer].  ?? Take a bath.  ?? Do not drink alcohol.  ?? Do not make any important decisions or sign legal documents.  ?? Stay with an adult.  ?? The first meal following your procedure should  be light and small. Avoid solid foods if you feel sick to your stomach (nauseous) or if you throw up (vomit).  ?? Drink enough fluids to keep your urine clear or pale yellow.  ?? Only take your usual medicines or new medicines if your caregiver approves them.  ?? Only take over-the-counter or prescription medicines for pain, discomfort, or fever as directed by your caregiver.  ?? Keep all follow-up appointments as directed by your caregiver.  SEEK IMMEDIATE MEDICAL CARE IF:   ?? You are not feeling normal or behaving normally after 24 hours.  ?? You have persistent nausea and vomiting.  ?? You are unable to drink fluids or eat food.  ?? You have difficulty urinating.  ?? You have difficulty breathing or speaking.  ?? You have blue or gray skin.  ?? There is difficulty waking or you cannot be woken up.  ?? You have heavy bleeding, redness, or a lot of swelling where the sedative or anesthesia entered your skin (intravenous site).  ?? You have a rash.  MAKE SURE YOU:  ?? Understand these instructions.  ?? Will watch your condition.  ?? Will get help right away if you are not doing well or get worse.  Document Released: 10/13/2005 Document Revised: 04/13/2012 Document Reviewed: 02/11/2012  Exit[employer]?? Patient Information ??2014  [employer]

## 2015-08-01 NOTE — Unmapped (Addendum)
DEPARTMENT OF ANESTHESIOLOGY  PRE-PROCEDURAL EVALUATION    Garrett Reynolds is a 64 y.o. year old male presenting for:    Procedure(s):  CYSTOSCOPY, RETROGRADE URETHROGRAM, ANTEGRADE URETHROGRAM, POSSIBLE URETHRAL DILATION    Surgeon:   Tana Felts, MD PHD    Chief Complaint     Urethral stricture, unspecified location, unspecified stricture type [W96.*    Review of Systems     Anesthesia Evaluation    Patient summary reviewed and CPC/PAT note reviewed.  All other systems reviewed and are negative.     No history of anesthetic complications         Cardiovascular:    Exercise tolerance: poor  Duke Met score: 3 - Walking on a flat surface for one or two blocks.Hypertension is well controlled.    (-) CABG/stent, CHF.  ROS comment: HLD - on statin    ECHO 2008  Normal LV function  Bubble study normal without shunt  No regional wall motion abnormalities    Stress 2007  Normal study  Negative for ischemia    Neuro/Muscoloskeletal/Psych:    (+) psychiatric history and depression.  CVA (2008 - no residual symptoms).  (-) seizures.     Pulmonary:    (+) shortness of breath.   COPD.    ROS comment: Heavy tobacco use      GI/Hepatic/Renal:    GERD is well controlled.  Chronic renal disease (CPC note with CKD III, but Cr < 1 on recent labs).    Endo/Other:    Diabetes, well controlled, type 2, using insulin.    (-) no anemia, no thrombocytopenia.       Past Medical History     Past Medical History   Diagnosis Date   ??? Urinary retention    ??? COPD (chronic obstructive pulmonary disease)    ??? Diabetes mellitus    ??? Hypertension    ??? FH: allergy    ??? High cholesterol    ??? Stroke    ??? GERD (gastroesophageal reflux disease)        Past Surgical History     Past Surgical History   Procedure Laterality Date   ??? Hernia repair     ??? Tendon repair     ??? Rotator cuff repair         Family History     History reviewed. No pertinent family history.    Social History     Social History     Social History   ??? Marital Status: Married      Spouse Name: N/A   ??? Number of Children: N/A   ??? Years of Education: N/A     Occupational History   ??? Not on file.     Social History Main Topics   ??? Smoking status: Current Every Day Smoker -- 1.00 packs/day     Types: Cigarettes   ??? Smokeless tobacco: Not on file   ??? Alcohol Use: Yes      Comment: rare   ??? Drug Use: No   ??? Sexual Activity: Not on file     Other Topics Concern   ??? Caffeine Use Yes   ??? Exercise No   ??? Seat Belt Yes     Social History Narrative       Medications     Allergies:  No Known Allergies    Home Meds:  Prior to Admission medications as of 08/01/15 0935   Medication Sig Taking?   albuterol (PROVENTIL;VENTOLIN;PROAIR) 90 mcg/actuation  inhaler Inhale 2 puffs into the lungs every 6 hours as needed for Wheezing. Yes   DULoxetine (CYMBALTA) 60 MG capsule Take 60 mg by mouth daily. Yes   lisinopril-hydrochlorothiazide (PRINZIDE,ZESTORETIC) 20-12.5 mg per tablet Take 1 tablet by mouth daily. Yes   loratadine (CLARITIN) 10 mg tablet Take 10 mg by mouth daily. Yes   metFORMIN (GLUCOPHAGE) 1000 MG tablet Take 1,000 mg by mouth 2 times a day with meals. Yes   mirtazapine (REMERON) 30 MG tablet Take 30 mg by mouth at bedtime. Yes   ranitidine (ZANTAC) 150 MG tablet Take 150 mg by mouth 2 times a day. Yes   roPINIRole (REQUIP) 1 MG tablet Take 1 mg by mouth 3 times a day. Yes   rosuvastatin (CRESTOR) 40 MG tablet Take 40 mg by mouth daily. Yes   tiotropium (SPIRIVA) 18 mcg Inhale 18 mcg into the lungs daily. Yes   traZODone (DESYREL) 100 MG tablet Take 100 mg by mouth at bedtime. Yes   zolpidem (AMBIEN) 10 mg tablet Take 10 mg by mouth at bedtime as needed for Sleep. Yes   cholecalciferol, vitamin D3, 1000 units tablet Take 1,000 Units by mouth daily.    ciprofloxacin HCl (CIPRO) 500 MG tablet Take 1 tablet (500 mg total) by mouth 2 times a day. Start 7 days prior to surgery    docusate sodium (COLACE) 100 MG capsule Take 100 mg by mouth 2 times a day.    insulin aspart (NOVOLOG) 100 unit/mL injection  Inject subcutaneously 2 times a day.       insulin glargine (LANTUS) 100 unit/mL injection Inject subcutaneously at bedtime.    liraglutide (VICTOZA 3-PAK) 0.6 mg/0.1 mL (18 mg/3 mL) PnIj Inject subcutaneously.    sildenafil (VIAGRA) 100 MG tablet Take 100 mg by mouth daily as needed for Erectile Dysfunction.        Inpatient Meds:  Scheduled:    Continuous:      PRN: ceFAZolin (ANCEF) IVPB    Vital Signs     Wt Readings from Last 3 Encounters:   07/19/15 235 lb (106.595 kg)   06/22/15 234 lb (106.142 kg)     Ht Readings from Last 3 Encounters:   07/19/15 6' 2 (1.88 m)   06/22/15 6' 2 (1.88 m)     Temp Readings from Last 3 Encounters:   07/19/15 98.3 ??F (36.8 ??C) Oral   06/22/15 97.1 ??F (36.2 ??C) Oral     BP Readings from Last 3 Encounters:   07/19/15 110/64   06/22/15 140/72     Pulse Readings from Last 3 Encounters:   07/19/15 94   06/22/15 79     SpO2 Readings from Last 3 Encounters:   07/19/15 95%       Physical Exam     Airway:     Mallampati: II  Mouth Opening: >2 FB  TM distance: > = 3 FB   (+) facial hair    Dental:      (+) upper dentures        Pulmonary:      (+) decreased breath sounds and wheezes.    Cardiovascular:     Rhythm: regular    Neuro/Musculoskeletal/Psych:    Mental status: alert and oriented to person, place and time.          Abdominal:     Obese.  Abdomen: soft.    Current OB Status:       Other Findings:        Laboratory  Data     Lab Results   Component Value Date    WBC 12.0* 07/19/2015    HGB 15.2 07/19/2015    HCT 45.5 07/19/2015    MCV 90.1 07/19/2015    PLT 216 07/19/2015       No results found for: [facility]    Lab Results   Component Value Date    GLUCOSE 223* 07/19/2015    BUN 24 07/19/2015    CO2 28 07/19/2015    CREATININE 0.74 07/19/2015    K 4.0 07/19/2015    NA 138 07/19/2015    CL 103 07/19/2015    CALCIUM 8.9 07/19/2015       No results found for: PTT, INR    No results found for: PREGTESTUR, PREGSERUM, HCG, HCGQUANT    Anesthesia Plan     ASA 3     Anesthesia Type:   general.   PONV prophylaxis.    Intravenous induction.    Anesthetic plan and risks discussed with patient.    Plan, alternatives, and risks of anesthesia, including death, have been explained to and discussed with the patient/legal guardian.  By my assessment, the patient/legal guardian understands and agrees.  Scenario presented in detail.  Questions answered.    Blood products not discussed.

## 2015-08-01 NOTE — Unmapped (Signed)
UROLOGY PRE-OP H&P NOTE    H&P reviewed and updated. Changes listed below.    History of Present Illness:  Garrett Reynolds is a 64 y.o. male with a urethral stricture who was transferred from [place].    Medical History:  Past Medical History   Diagnosis Date   ??? Urinary retention    ??? COPD (chronic obstructive pulmonary disease)    ??? Diabetes mellitus    ??? Hypertension    ??? FH: allergy    ??? High cholesterol    ??? Stroke    ??? GERD (gastroesophageal reflux disease)        Past Surgical History   Procedure Laterality Date   ??? Hernia repair     ??? Tendon repair     ??? Rotator cuff repair     ??? Cystoscopy N/A 08/01/2015     Procedure: CYSTOSCOPY, RETROGRADE URETHROGRAM, ANTEGRADE URETHROGRAM, POSSIBLE URETHRAL DILATION;  Surgeon: Tana Felts, MD PHD;  Location: [facility] OR;  Service: Urology;  Laterality: N/A;       Medications:   Outpatient Meds:  Discharge Medication List as of 08/01/2015  2:02 PM      CONTINUE these medications which have NOT CHANGED    Details   albuterol (PROVENTIL;VENTOLIN;PROAIR) 90 mcg/actuation inhaler Inhale 2 puffs into the lungs every 6 hours as needed for Wheezing., Until Discontinued, Historical Med      DULoxetine (CYMBALTA) 60 MG capsule Take 60 mg by mouth daily., Until Discontinued, Historical Med      lisinopril-hydrochlorothiazide (PRINZIDE,ZESTORETIC) 20-12.5 mg per tablet Take 1 tablet by mouth daily., Until Discontinued, Historical Med      loratadine (CLARITIN) 10 mg tablet Take 10 mg by mouth daily., Until Discontinued, Historical Med      metFORMIN (GLUCOPHAGE) 1000 MG tablet Take 1,000 mg by mouth 2 times a day with meals., Until Discontinued, Historical Med      mirtazapine (REMERON) 30 MG tablet Take 30 mg by mouth at bedtime., Until Discontinued, Historical Med      ranitidine (ZANTAC) 150 MG tablet Take 150 mg by mouth 2 times a day., Until Discontinued, Historical Med      roPINIRole (REQUIP) 1 MG tablet Take 1 mg by mouth 3 times a day., Until Discontinued, Historical Med       rosuvastatin (CRESTOR) 40 MG tablet Take 40 mg by mouth daily., Until Discontinued, Historical Med      tiotropium (SPIRIVA) 18 mcg Inhale 18 mcg into the lungs daily., Until Discontinued, Historical Med      traZODone (DESYREL) 100 MG tablet Take 100 mg by mouth at bedtime., Until Discontinued, Historical Med      zolpidem (AMBIEN) 10 mg tablet Take 10 mg by mouth at bedtime as needed for Sleep., Until Discontinued, Historical Med      cholecalciferol, vitamin D3, 1000 units tablet Take 1,000 Units by mouth daily., Until Discontinued, Historical Med      ciprofloxacin HCl (CIPRO) 500 MG tablet Take 1 tablet (500 mg total) by mouth 2 times a day. Start 7 days prior to surgery, Starting 06/22/2015, Until Discontinued, Print      docusate sodium (COLACE) 100 MG capsule Take 100 mg by mouth 2 times a day., Until Discontinued, Historical Med      insulin aspart (NOVOLOG) 100 unit/mL injection Inject subcutaneously 2 times a day.   , Until Discontinued, Historical Med      insulin glargine (LANTUS) 100 unit/mL injection Inject subcutaneously at bedtime., Until Discontinued, Historical Med  liraglutide (VICTOZA 3-PAK) 0.6 mg/0.1 mL (18 mg/3 mL) PnIj Inject subcutaneously., Until Discontinued, Historical Med      sildenafil (VIAGRA) 100 MG tablet Take 100 mg by mouth daily as needed for Erectile Dysfunction., Until Discontinued, Historical Med             Allergies: No Known Allergies    SH:   Social History   Substance Use Topics   ??? Smoking status: Current Every Day Smoker -- 1.00 packs/day     Types: Cigarettes   ??? Smokeless tobacco: Not on file   ??? Alcohol Use: Yes      Comment: rare       FH: Noncontributory.    ROS: As per HPI. Otherwise negative.    Objective:  Filed Vitals:    08/01/15 1300   BP: 127/70   Pulse: 78   Temp: 96.8 ??F (36 ??C)   Resp:    SpO2:         Physical Examination:  Gen: NAD, AAOx3  CV: RRR  Resp: No respiratory distress  Abd: Soft, NT/ND  Ext: Warm and well perfused    Labs:  Lab Results    Component Value Date    WBC 12.0* 07/19/2015    HGB 15.2 07/19/2015    HCT 45.5 07/19/2015    PLT 216 07/19/2015     Lab Results   Component Value Date    NA 138 07/19/2015    K 4.0 07/19/2015    CL 103 07/19/2015    CO2 28 07/19/2015    BUN 24 07/19/2015    CREATININE 0.74 07/19/2015    GLUCOSE 223* 07/19/2015    CALCIUM 8.9 07/19/2015     No results found for: AST, ALT, BILITOT, BILIDIRECT, ALKPHOS, ALBUMIN, AMYLASE, LIPASENo results found for: INR, PROTIME  No results found for: PSA    Assessment:  Garrett Reynolds is a 64 y.o. male with a urethral stricture who was transferred from [place].    Plan:  Consented for cystoscopy, antegrade and retrograde urethrogram  Antibiotics on call to OR  Discussed the indications, risks, benefits, alternatives, and potential complications (bleeding, infection) of the procedure after detailing the pathophysiology of the patient's condition    Payton Mccallum, MD  Urology Resident

## 2015-08-01 NOTE — Unmapped (Signed)
Suprapubic catheter is capped, no orders. MD says pt is to go home with it capped, should be able to urinate natural.

## 2015-08-01 NOTE — Unmapped (Signed)
INTRA-OP POST BRIEFING NOTE: Garrett Reynolds      Specimens:     Prior to leaving the room: Nurse confirmed name of procedure, completion of instrument, sponge & needle counts, reads specimen labels aloud including patient name and addresses any equipment issues? Nurse confirmed wound class. Nurse to surgeon and anesthesia: What are key concerns for recovery and management of the patient?  Yes      Blood products stored at appropriate temperatures prior to return to blood bank (if applicable)? N/A      Other Comments:     Signed: Jennefer Bravo Emmelina Mcloughlin??    Date: 08/01/2015    Time: 12:57 PM

## 2015-08-10 ENCOUNTER — Ambulatory Visit: Admit: 2015-08-10 | Discharge: 2015-08-10 | Payer: PRIVATE HEALTH INSURANCE

## 2015-08-10 DIAGNOSIS — IMO0002 Reserved for concepts with insufficient information to code with codable children: Secondary | ICD-10-CM

## 2015-08-10 NOTE — Unmapped (Signed)
Chief Complaint   Patient presents with   ??? Follow-up     pvr     History of Present Illness:    Garrett Reynolds is an 64 y.o. White or Caucasian male who presents for PVR check, plan to remove the SPT tube if the PVR is low.  Voiding without difficulty, state dysuria at the end of void. PVR today is 23 ml. SPT cleared for removal.    History of Present Illness from 06/22/2015 with Dr Ree Reynolds  64 year old male who presents by referral from the [place] for evaluation of urethral stricture. Patient with several year history of LUTS. PCP treated for BPH however symptoms persisted. Then referred to [facility] urology who did a cysto and noted urethral stricture. Patient SPT for 6 months. Has it changed monthly.   Has diabetes and COPD.  Smokes 1 pack per day which is down from 4 packs per day for many years.   Plan  Cystoscopy, retrograde urethrogram, antegrade urethrogram   If small bulbar stricture, can perform one time dilation  If long stricture will need to discuss formal recontruction      Review of Systems   Constitutional: Negative for fever, chills and fatigue.   Genitourinary: Positive for dysuria. Negative for urgency, frequency, hematuria and difficulty urinating.        SPT capped   All other systems reviewed and are negative.      Allergies  Review of patient's allergies indicates no known allergies.    Medications  Outpatient Encounter Prescriptions as of 08/10/2015   Medication Sig Dispense Refill   ??? albuterol (PROVENTIL;VENTOLIN;PROAIR) 90 mcg/actuation inhaler Inhale 2 puffs into the lungs every 6 hours as needed for Wheezing.     ??? cholecalciferol, vitamin D3, 1000 units tablet Take 1,000 Units by mouth daily.     ??? ciprofloxacin HCl (CIPRO) 500 MG tablet Take 1 tablet (500 mg total) by mouth 2 times a day. Start 7 days prior to surgery 14 tablet 0   ??? docusate sodium (COLACE) 100 MG capsule Take 100 mg by mouth 2 times a day.     ??? DULoxetine (CYMBALTA) 60 MG capsule Take 60 mg by mouth daily.     ??? insulin aspart  (NOVOLOG) 100 unit/mL injection Inject subcutaneously 2 times a day.        ??? insulin glargine (LANTUS) 100 unit/mL injection Inject subcutaneously at bedtime.     ??? liraglutide (VICTOZA 3-PAK) 0.6 mg/0.1 mL (18 mg/3 mL) PnIj Inject subcutaneously.     ??? lisinopril-hydrochlorothiazide (PRINZIDE,ZESTORETIC) 20-12.5 mg per tablet Take 1 tablet by mouth daily.     ??? loratadine (CLARITIN) 10 mg tablet Take 10 mg by mouth daily.     ??? metFORMIN (GLUCOPHAGE) 1000 MG tablet Take 1,000 mg by mouth 2 times a day with meals.     ??? mirtazapine (REMERON) 30 MG tablet Take 30 mg by mouth at bedtime.     ??? oxyCODONE (ROXICODONE) 5 MG immediate release tablet Take 1 tablet (5 mg total) by mouth every 4 hours as needed for Pain. 18 tablet 0   ??? polyethylene glycol (GLYCOLAX) 17 gram/dose powder Take 17 g by mouth 2 times a day as needed. 255 g 0   ??? ranitidine (ZANTAC) 150 MG tablet Take 150 mg by mouth 2 times a day.     ??? roPINIRole (REQUIP) 1 MG tablet Take 1 mg by mouth 3 times a day.     ??? rosuvastatin (CRESTOR) 40 MG tablet Take  40 mg by mouth daily.     ??? sildenafil (VIAGRA) 100 MG tablet Take 100 mg by mouth daily as needed for Erectile Dysfunction.     ??? tiotropium (SPIRIVA) 18 mcg Inhale 18 mcg into the lungs daily.     ??? traZODone (DESYREL) 100 MG tablet Take 100 mg by mouth at bedtime.     ??? zolpidem (AMBIEN) 10 mg tablet Take 10 mg by mouth at bedtime as needed for Sleep.       No facility-administered encounter medications on file as of 08/10/2015.        Histories  He has a past medical history of Urinary retention; COPD (chronic obstructive pulmonary disease); Diabetes mellitus; Hypertension; FH: allergy; High cholesterol; Stroke; and GERD (gastroesophageal reflux disease).    He has past surgical history that includes Hernia repair; Tendon repair; Rotator cuff repair; and Cystoscopy (N/A, 08/01/2015).    His family history is not on file.    He reports that he has been smoking Cigarettes.  He has been smoking about  1.00 pack per day. He does not have any smokeless tobacco history on file. He reports that he drinks alcohol. He reports that he does not use illicit drugs.    The following portions of the patient's history were reviewed and updated as appropriate: allergies, current medications, past family history, past medical history, past social history, past surgical history and problem list.    Blood pressure 156/81, pulse 83, temperature 97.4 ??F (36.3 ??C), temperature source Oral, resp. rate 16, height 6' 2 (1.88 m), weight 240 lb (108.863 kg).  Physical Exam   Constitutional: He is oriented to person, place, and time. He appears well-developed and well-nourished. No distress.   HENT:   Head: Normocephalic and atraumatic.   Right Ear: External ear normal.   Left Ear: External ear normal.   Eyes: Pupils are equal, round, and reactive to light.   Neck: Normal range of motion. Neck supple.   Cardiovascular: Normal rate.    Pulmonary/Chest: Effort normal.   Abdominal: Soft. He exhibits no distension. There is no tenderness.       Musculoskeletal: Normal range of motion.   Neurological: He is alert and oriented to person, place, and time.   Skin: Skin is warm and dry. He is not diaphoretic.   Psychiatric: He has a normal mood and affect. His behavior is normal. Judgment and thought content normal.        Assessment    Garrett Reynolds is an 64 y.o. White or Caucasian male who presents for SPT removal after repair of Urethral stricture.      Plan  Urethral Stricture   Local cysto in 3 months in the office   Urethrogram prior to office visit   Advised to use OTC AZO for the dysuria   Garrett Reynolds verbalized understanding of the plan of care       Medical Decision Making  The following items were considered in medical decision making:  Discussion of patient care with other providers  Review / order clinical lab tests  Review / order radiology tests  Review / order other diagnostic tests/interventions

## 2015-08-10 NOTE — Unmapped (Signed)
Will have you come back in 3 months for Local Cysto in the office  Xray, Urethrogram prior to the Cysto

## 2015-11-09 ENCOUNTER — Ambulatory Visit: Admit: 2015-11-09 | Discharge: 2015-11-09 | Payer: PRIVATE HEALTH INSURANCE

## 2015-11-09 ENCOUNTER — Inpatient Hospital Stay: Admit: 2015-11-09 | Payer: PRIVATE HEALTH INSURANCE

## 2015-11-09 DIAGNOSIS — N358 Other urethral stricture: Secondary | ICD-10-CM

## 2015-11-09 DIAGNOSIS — N35011 Post-traumatic bulbous urethral stricture: Secondary | ICD-10-CM

## 2015-11-09 MED ORDER — OMNIPAQUE (iohexol) 240 mg iodine/mL 40 mL
240 | Freq: Once | INTRAVENOUS | Status: AC | PRN
Start: 2015-11-09 — End: 2015-11-10

## 2015-11-09 MED FILL — OMNIPAQUE 240 MG IODINE/ML INTRAVENOUS SOLUTION: 240 240 mg iodine/mL | INTRAVENOUS | Qty: 40

## 2015-11-09 NOTE — Unmapped (Signed)
Chief Complaint   Patient presents with   ??? Follow-up     urethral stricture     Procedure Performed:  Cystoscopy       Physician:     Yetta Barre    Procedure in Detail:     The patient signed an informed consent.  Prophylactic antibiotics were administered.  The patient was placed in the supine position.  Genitalia was prepped and draped in the usual sterile fashion.  Intraurethral anesthetic was administered.   Flexible cystoscopy was performed today.  Recurrent USD at the mid-bulbar urethra.   Before dilation:    Three months After dilation          Review of Systems   Constitutional: Negative for fever, chills and fatigue.   Genitourinary: Negative for difficulty urinating.       Allergies  Review of patient's allergies indicates no known allergies.    Medications  Outpatient Encounter Prescriptions as of 11/09/2015   Medication Sig Dispense Refill   ??? albuterol (PROVENTIL;VENTOLIN;PROAIR) 90 mcg/actuation inhaler Inhale 2 puffs into the lungs every 6 hours as needed for Wheezing.     ??? cholecalciferol, vitamin D3, 1000 units tablet Take 1,000 Units by mouth daily.     ??? ciprofloxacin HCl (CIPRO) 500 MG tablet Take 1 tablet (500 mg total) by mouth 2 times a day. Start 7 days prior to surgery 14 tablet 0   ??? docusate sodium (COLACE) 100 MG capsule Take 100 mg by mouth 2 times a day.     ??? DULoxetine (CYMBALTA) 60 MG capsule Take 60 mg by mouth daily.     ??? insulin aspart (NOVOLOG) 100 unit/mL injection Inject subcutaneously 2 times a day.        ??? insulin glargine (LANTUS) 100 unit/mL injection Inject subcutaneously at bedtime.     ??? liraglutide (VICTOZA 3-PAK) 0.6 mg/0.1 mL (18 mg/3 mL) PnIj Inject subcutaneously.     ??? lisinopril-hydrochlorothiazide (PRINZIDE,ZESTORETIC) 20-12.5 mg per tablet Take 1 tablet by mouth daily.     ??? loratadine (CLARITIN) 10 mg tablet Take 10 mg by mouth daily.     ??? metFORMIN (GLUCOPHAGE) 1000 MG tablet Take 1,000 mg by mouth 2 times a day with meals.     ??? mirtazapine (REMERON)  30 MG tablet Take 30 mg by mouth at bedtime.     ??? oxyCODONE (ROXICODONE) 5 MG immediate release tablet Take 1 tablet (5 mg total) by mouth every 4 hours as needed for Pain. 18 tablet 0   ??? polyethylene glycol (GLYCOLAX) 17 gram/dose powder Take 17 g by mouth 2 times a day as needed. 255 g 0   ??? ranitidine (ZANTAC) 150 MG tablet Take 150 mg by mouth 2 times a day.     ??? roPINIRole (REQUIP) 1 MG tablet Take 1 mg by mouth 3 times a day.     ??? rosuvastatin (CRESTOR) 40 MG tablet Take 40 mg by mouth daily.     ??? sildenafil (VIAGRA) 100 MG tablet Take 100 mg by mouth daily as needed for Erectile Dysfunction.     ??? tiotropium (SPIRIVA) 18 mcg Inhale 18 mcg into the lungs daily.     ??? traZODone (DESYREL) 100 MG tablet Take 100 mg by mouth at bedtime.     ??? zolpidem (AMBIEN) 10 mg tablet Take 10 mg by mouth at bedtime as needed for Sleep.       No facility-administered encounter medications on file as of 11/09/2015.  Histories  He has a past medical history of Urinary retention; COPD (chronic obstructive pulmonary disease); Diabetes mellitus; Hypertension; FH: allergy; High cholesterol; Stroke; and GERD (gastroesophageal reflux disease).    He has past surgical history that includes Hernia repair; Tendon repair; Rotator cuff repair; and Cystoscopy (N/A, 08/01/2015).    His family history is not on file.    He reports that he has been smoking Cigarettes.  He has been smoking about 1.00 pack per day. He does not have any smokeless tobacco history on file. He reports that he drinks alcohol. He reports that he does not use illicit drugs.        Pulse 80, temperature 98.6 ??F (37 ??C), temperature source Oral, resp. rate 16, height 6' 2 (1.88 m), weight 240 lb (108.863 kg).  Physical Exam         Assessment    Recurrent mid-bulbar urethral stricture  S/P dilation 08/01/2015      Active smoker  Plan  Perineal urethroplasty, possible use of buccal graft  Complications include but not limited to bleeding, rectal injury,  anaesthesia related, postoperative infection, recurrence and or erectile dysfunction. Also mentioned complications related to the harvest of the buccal graft including but not limited to bleeding or infection at the graft harvest site, oral pain, and or difficulty chewing.       Discussed with patient and packed smoking on the outcome  I spent 15 minutes in the encounter face to face with patient in counseling and answering patient questions. This was in addition to the procedure time.

## 2015-12-19 NOTE — Telephone Encounter (Signed)
Patient needs to know what he need to do pre op for his surgery.  Surgery is scheduled March 8.

## 2015-12-19 NOTE — Unmapped (Signed)
Spoke with pt, pt questioned if he needed to see anesthesia prior to surgery. Advised pt that he does not need to see anesthesia prior to surgery. Pt expressed understanding.

## 2015-12-31 NOTE — Unmapped (Signed)
LMOM with preop instructionsand arrival time

## 2016-01-01 MED FILL — CEFAZOLIN 2 GRAM/50 ML IN DEXTROSE (ISO-OSMOTIC) INTRAVENOUS PIGGYBACK: 2 2 gram/50 mL | INTRAVENOUS | Qty: 50

## 2016-01-01 NOTE — Unmapped (Addendum)
Surgery time change for 01/02/16 was given to the patient.Garrett Reynolds at [facility] at 1230 for 1430 surgery.  Bring inhaler.  Take only 1/2 of normal evening dose Lantus.

## 2016-01-02 ENCOUNTER — Observation Stay
Admit: 2016-01-02 | Discharge: 2016-01-03 | Disposition: A | Discharge status: Home / Self Care | Payer: PRIVATE HEALTH INSURANCE | Source: Ambulatory Visit

## 2016-01-02 DIAGNOSIS — N359 Urethral stricture, unspecified: Secondary | ICD-10-CM

## 2016-01-02 LAB — URINALYSIS W/RFL MICROSCOP, RFL CULTURE
Bilirubin, UA: NEGATIVE
Blood, UA: NEGATIVE
Glucose, UA: NEGATIVE mg/dL
Hyaline Casts, UA: >100 /LPF (ref 0–2)
Ketones, UA: 5 mg/dL
Leukocytes, UA: NEGATIVE
Nitrite, UA: NEGATIVE
Protein, UA: 100 mg/dL
RBC, UA: 4 /HPF (ref 0–3)
Specific Gravity, UA: 1.024 (ref 1.005–1.035)
Squam Epithel, UA: 1 /HPF (ref 0–5)
Urobilinogen, UA: <2 mg/dL (ref 0.2–1.9)
WBC, UA: 4 /HPF (ref 0–5)
pH, UA: 5 (ref 5.0–8.0)

## 2016-01-02 LAB — POC GLU MONITORING DEVICE
POC Glucose Monitoring Device: 133 mg/dL — ABNORMAL HIGH (ref 70–100)
POC Glucose Monitoring Device: 181 mg/dL — ABNORMAL HIGH (ref 70–100)

## 2016-01-02 MED ORDER — umeclidinium (INCRUSE ELLIPTA) powder for inhalation DsDv 62.5 mcg
62.5 | Freq: Every day | RESPIRATORY_TRACT | Status: AC
Start: 2016-01-02 — End: 2016-01-03
  Administered 2016-01-03: 13:00:00 62.5 ug via RESPIRATORY_TRACT

## 2016-01-02 MED ORDER — ondansetron (ZOFRAN) 4 mg/2 mL injection
4 | INTRAMUSCULAR | Status: AC | PRN
Start: 2016-01-02 — End: 2016-01-02
  Administered 2016-01-02: 21:00:00 4 via INTRAVENOUS

## 2016-01-02 MED ORDER — ondansetron (ZOFRAN) 4 mg/2 mL injection 4 mg
4 | Freq: Three times a day (TID) | INTRAMUSCULAR | Status: AC | PRN
Start: 2016-01-02 — End: 2016-01-02

## 2016-01-02 MED ORDER — dexamethasone (DECADRON) injection 4 mg
4 | Freq: Once | INTRAMUSCULAR | Status: AC | PRN
Start: 2016-01-02 — End: 2016-01-02

## 2016-01-02 MED ORDER — diphenhydrAMINE (BENADRYL) capsule 25 mg
25 | Freq: Four times a day (QID) | ORAL | Status: AC | PRN
Start: 2016-01-02 — End: 2016-01-03

## 2016-01-02 MED ORDER — HYDROmorphone (DILAUDID) injection Syrg 0.6 mg
1 | INTRAMUSCULAR | Status: AC | PRN
Start: 2016-01-02 — End: 2016-01-02

## 2016-01-02 MED ORDER — acetaminophen (TYLENOL) tablet 650 mg
325 | Freq: Four times a day (QID) | ORAL | Status: AC | PRN
Start: 2016-01-02 — End: 2016-01-03
  Administered 2016-01-03 (×2): 650 mg via ORAL

## 2016-01-02 MED ORDER — famotidine (PEPCID) tablet 20 mg
20 | Freq: Two times a day (BID) | ORAL | Status: AC
Start: 2016-01-02 — End: 2016-01-03
  Administered 2016-01-03 (×2): 20 mg via ORAL

## 2016-01-02 MED ORDER — albuterol (PROVENTIL;VENTOLIN;PROAIR) inhaler 2 puff
90 | Freq: Four times a day (QID) | RESPIRATORY_TRACT | Status: AC | PRN
Start: 2016-01-02 — End: 2016-01-03

## 2016-01-02 MED ORDER — phenylephrine (NEO-SYNEPHRINE) injection
10 | INTRAMUSCULAR | Status: AC | PRN
Start: 2016-01-02 — End: 2016-01-02
  Administered 2016-01-02 (×2): 100 via INTRAVENOUS
  Administered 2016-01-02: 19:00:00 200 via INTRAVENOUS
  Administered 2016-01-02 (×3): 100 via INTRAVENOUS

## 2016-01-02 MED ORDER — lactated ringers infusion
INTRAVENOUS | Status: AC
Start: 2016-01-02 — End: 2016-01-02

## 2016-01-02 MED ORDER — naloxone (NARCAN) injection 0.04 mg
0.4 | INTRAMUSCULAR | Status: AC | PRN
Start: 2016-01-02 — End: 2016-01-02

## 2016-01-02 MED ORDER — mirtazapine (REMERON) tablet 30 mg
30 | Freq: Every evening | ORAL | Status: AC
Start: 2016-01-02 — End: 2016-01-03
  Administered 2016-01-03: 03:00:00 30 mg via ORAL

## 2016-01-02 MED ORDER — roPINIRole (REQUIP) tablet 1 mg
1 | Freq: Three times a day (TID) | ORAL | Status: AC
Start: 2016-01-02 — End: 2016-01-03
  Administered 2016-01-03: 14:00:00 1 mg via ORAL

## 2016-01-02 MED ORDER — rosuvastatin (CRESTOR) tablet 40 mg
40 | Freq: Every day | ORAL | Status: AC
Start: 2016-01-02 — End: 2016-01-03
  Administered 2016-01-03: 14:00:00 40 mg via ORAL

## 2016-01-02 MED ORDER — fentaNYL (SUBLIMAZE) injection 25 mcg
50 | INTRAMUSCULAR | Status: AC | PRN
Start: 2016-01-02 — End: 2016-01-02

## 2016-01-02 MED ORDER — neomycin-polymyxin B (NEOSPORIN) 1 mL in sodium chloride, irrigation 0.9 % 1,000 mL irrigation
0.9 | Status: AC | PRN
Start: 2016-01-02 — End: 2016-01-02
  Administered 2016-01-02: 20:00:00 2

## 2016-01-02 MED ORDER — HYDROmorphone (DILAUDID) injection Syrg 0.4 mg
1 | INTRAMUSCULAR | Status: AC | PRN
Start: 2016-01-02 — End: 2016-01-02

## 2016-01-02 MED ORDER — ondansetron (ZOFRAN) 4 mg/2 mL injection 8 mg
4 | Freq: Three times a day (TID) | INTRAMUSCULAR | Status: AC | PRN
Start: 2016-01-02 — End: 2016-01-03

## 2016-01-02 MED ORDER — polyethylene glycol (MIRALAX) packet 17 g
17 | Freq: Two times a day (BID) | ORAL | Status: AC
Start: 2016-01-02 — End: 2016-01-03

## 2016-01-02 MED ORDER — oxyCODONE (ROXICODONE) immediate release tablet 10 mg
5 | ORAL | Status: AC | PRN
Start: 2016-01-02 — End: 2016-01-03
  Administered 2016-01-03 (×4): 10 mg via ORAL

## 2016-01-02 MED ORDER — heparin (porcine) injection 5,000 Units
5000 | Freq: Three times a day (TID) | INTRAMUSCULAR | Status: AC
Start: 2016-01-02 — End: 2016-01-03
  Administered 2016-01-03 (×2): 5000 [IU] via SUBCUTANEOUS

## 2016-01-02 MED ORDER — proMETHazine (PHENERGAN) injection 6.25 mg
25 | Freq: Four times a day (QID) | INTRAMUSCULAR | Status: AC | PRN
Start: 2016-01-02 — End: 2016-01-02

## 2016-01-02 MED ORDER — ketorolac (TORADOL) injection 30 mg
30 | INTRAMUSCULAR | Status: AC | PRN
Start: 2016-01-02 — End: 2016-01-02
  Administered 2016-01-02: 22:00:00 30 via INTRAVENOUS

## 2016-01-02 MED ORDER — esmolol (BREVIBLOC) injection
100 | INTRAVENOUS | Status: AC | PRN
Start: 2016-01-02 — End: 2016-01-02
  Administered 2016-01-02: 22:00:00 30 via INTRAVENOUS

## 2016-01-02 MED ORDER — bupivacaine-EPINEPHrine (PF) (SENSORCAINE) 0.5%-0.005 mg/mL 10 mL, lidocaine (PF) (XYLOCAINE) 10 mg/mL (1 %) 10 mL 20 mL injection
INTRAMUSCULAR | Status: AC | PRN
Start: 2016-01-02 — End: 2016-01-02
  Administered 2016-01-02: 21:00:00 7 via INTRAMUSCULAR

## 2016-01-02 MED ORDER — fentaNYL (SUBLIMAZE) injection
50 | INTRAMUSCULAR | Status: AC | PRN
Start: 2016-01-02 — End: 2016-01-02
  Administered 2016-01-02 (×3): 50 via INTRAVENOUS
  Administered 2016-01-02: 19:00:00 100 via INTRAVENOUS

## 2016-01-02 MED ORDER — lactated ringers infusion
INTRAVENOUS | Status: AC | PRN
Start: 2016-01-02 — End: 2016-01-02
  Administered 2016-01-02 (×2): via INTRAVENOUS

## 2016-01-02 MED ORDER — fentaNYL (SUBLIMAZE) injection 50 mcg
50 | INTRAMUSCULAR | Status: AC | PRN
Start: 2016-01-02 — End: 2016-01-02

## 2016-01-02 MED ORDER — albuterol (PROVENTIL;VENTOLIN;PROAIR) inhaler
90 | RESPIRATORY_TRACT | Status: AC | PRN
Start: 2016-01-02 — End: 2016-01-02
  Administered 2016-01-02 (×2): 4 via RESPIRATORY_TRACT

## 2016-01-02 MED ORDER — dextrose 50 % in water (D50W) iv Syrg 25-50 mL
INTRAVENOUS | Status: AC | PRN
Start: 2016-01-02 — End: 2016-01-03

## 2016-01-02 MED ORDER — dexamethasone (DECADRON) injection
4 | INTRAMUSCULAR | Status: AC | PRN
Start: 2016-01-02 — End: 2016-01-02
  Administered 2016-01-02: 19:00:00 8 via INTRAVENOUS

## 2016-01-02 MED ORDER — insulin lispro (humaLOG) injection 0-8 Units
100 | Freq: Three times a day (TID) | SUBCUTANEOUS | Status: AC
Start: 2016-01-02 — End: 2016-01-03
  Administered 2016-01-03: 01:00:00 1 [IU] via SUBCUTANEOUS
  Administered 2016-01-03: 14:00:00 3 [IU] via SUBCUTANEOUS

## 2016-01-02 MED ORDER — ceFAZolin (ANCEF) IVPB 2 g in D5W (duplex)
2 | Freq: Three times a day (TID) | INTRAVENOUS | Status: AC
Start: 2016-01-02 — End: 2016-01-03
  Administered 2016-01-03 (×2): 2 g via INTRAVENOUS

## 2016-01-02 MED ORDER — diphenhydrAMINE (BENADRYL) capsule 50 mg
25 | Freq: Four times a day (QID) | ORAL | Status: AC | PRN
Start: 2016-01-02 — End: 2016-01-03

## 2016-01-02 MED ORDER — HYDROmorphone (DILAUDID) injection Syrg 0.5 mg
1 | INTRAMUSCULAR | Status: AC | PRN
Start: 2016-01-02 — End: 2016-01-03

## 2016-01-02 MED ORDER — oxyCODONE-acetaminophen (PERCOCET) 5-325 mg per tablet 2 tablet
5-325 | Freq: Once | ORAL | Status: AC
Start: 2016-01-02 — End: 2016-01-02

## 2016-01-02 MED ORDER — docusate sodium (COLACE) capsule 100 mg
100 | Freq: Two times a day (BID) | ORAL | Status: AC
Start: 2016-01-02 — End: 2016-01-03
  Administered 2016-01-03 (×2): 100 mg via ORAL

## 2016-01-02 MED ORDER — sodium chloride, irrigation 0.9 % irrigation
0.9 | Status: AC | PRN
Start: 2016-01-02 — End: 2016-01-02

## 2016-01-02 MED ORDER — fentaNYL (SUBLIMAZE) injection 12.5 mcg
50 | INTRAMUSCULAR | Status: AC | PRN
Start: 2016-01-02 — End: 2016-01-02

## 2016-01-02 MED ORDER — neostigmine methylsulfate (PROSTIGMIN) IV solution
1 | INTRAVENOUS | Status: AC | PRN
Start: 2016-01-02 — End: 2016-01-02
  Administered 2016-01-02: 21:00:00 4 via INTRAVENOUS

## 2016-01-02 MED ORDER — HYDROmorphone (DILAUDID) injection Syrg
2 | INTRAMUSCULAR | Status: AC | PRN
Start: 2016-01-02 — End: 2016-01-02
  Administered 2016-01-02: 22:00:00 .4 via INTRAVENOUS
  Administered 2016-01-02: 22:00:00 .2 via INTRAVENOUS
  Administered 2016-01-02 (×2): .4 via INTRAVENOUS

## 2016-01-02 MED ORDER — EPINEPHrine 50 MCG/ 10 ML SYRINGE FOR BRADYCARDIA
Status: AC | PRN
Start: 2016-01-02 — End: 2016-01-02
  Administered 2016-01-02 (×5): 5 via INTRAVENOUS
  Administered 2016-01-02: 20:00:00 10 via INTRAVENOUS

## 2016-01-02 MED ORDER — glycopyrrolate (ROBINUL) injection
0.2 | INTRAMUSCULAR | Status: AC | PRN
Start: 2016-01-02 — End: 2016-01-02
  Administered 2016-01-02: 21:00:00 .6 via INTRAVENOUS

## 2016-01-02 MED ORDER — succinylcholine (QUELICIN) injection
20 | INTRAMUSCULAR | Status: AC | PRN
Start: 2016-01-02 — End: 2016-01-02
  Administered 2016-01-02: 19:00:00 120 via INTRAVENOUS

## 2016-01-02 MED ORDER — traZODone (DESYREL) tablet 100 mg
100 | Freq: Every evening | ORAL | Status: AC
Start: 2016-01-02 — End: 2016-01-03
  Administered 2016-01-03: 03:00:00 100 mg via ORAL

## 2016-01-02 MED ORDER — loratadine (CLARITIN) tablet 10 mg
10 | Freq: Every day | ORAL | Status: AC
Start: 2016-01-02 — End: 2016-01-03
  Administered 2016-01-03: 14:00:00 10 mg via ORAL

## 2016-01-02 MED ORDER — oxyCODONE (ROXICODONE) immediate release tablet 5 mg
5 | ORAL | Status: AC | PRN
Start: 2016-01-02 — End: 2016-01-03

## 2016-01-02 MED ORDER — HYDROmorphone (DILAUDID) injection Syrg 1 mg
1 | INTRAMUSCULAR | Status: AC | PRN
Start: 2016-01-02 — End: 2016-01-03

## 2016-01-02 MED ORDER — lidocaine (PF) 20 mg/mL (2 %) Soln
20 | INTRAVENOUS | Status: AC | PRN
Start: 2016-01-02 — End: 2016-01-02
  Administered 2016-01-02: 19:00:00 50 via INTRAVENOUS

## 2016-01-02 MED ORDER — propofol 10 mg/ml (DIPRIVAN) injection
10 | INTRAVENOUS | Status: AC | PRN
Start: 2016-01-02 — End: 2016-01-02
  Administered 2016-01-02: 19:00:00 150 via INTRAVENOUS
  Administered 2016-01-02: 19:00:00 50 via INTRAVENOUS
  Administered 2016-01-02: 19:00:00 30 via INTRAVENOUS

## 2016-01-02 MED ORDER — HYDROmorphoneDILAUDIDinjectionSyrg02mg
1 | INTRAMUSCULAR | Status: AC | PRN
Start: 2016-01-02 — End: 2016-01-02

## 2016-01-02 MED ORDER — DULoxetine (CYMBALTA) DR capsule 60 mg
60 | Freq: Every day | ORAL | Status: AC
Start: 2016-01-02 — End: 2016-01-03
  Administered 2016-01-03: 14:00:00 60 mg via ORAL

## 2016-01-02 MED ORDER — ondansetron (ZOFRAN) 4 mg/2 mL injection 4 mg
4 | Freq: Three times a day (TID) | INTRAMUSCULAR | Status: AC | PRN
Start: 2016-01-02 — End: 2016-01-03

## 2016-01-02 MED ORDER — ceFAZolinANCEFIVPB2ginD5Wduplex
2 | INTRAVENOUS | Status: AC | PRN
Start: 2016-01-02 — End: 2016-01-02
  Administered 2016-01-02: 19:00:00 2 g via INTRAVENOUS

## 2016-01-02 MED ORDER — lisinopril (PRINIVIL,ZESTRIL) tablet 20 mg
20 | Freq: Every day | ORAL | Status: AC
Start: 2016-01-02 — End: 2016-01-03
  Administered 2016-01-03: 14:00:00 20 mg via ORAL

## 2016-01-02 MED ORDER — rocuronium (ZEMURON) injection
10 | INTRAVENOUS | Status: AC | PRN
Start: 2016-01-02 — End: 2016-01-02
  Administered 2016-01-02 (×2): 10 via INTRAVENOUS
  Administered 2016-01-02: 19:00:00 30 via INTRAVENOUS

## 2016-01-02 MED ORDER — glucose chewable tablet 12 g
4 | ORAL | Status: AC | PRN
Start: 2016-01-02 — End: 2016-01-03

## 2016-01-02 MED ORDER — zolpidem (AMBIEN) tablet 5 mg
5 | Freq: Every evening | ORAL | Status: AC | PRN
Start: 2016-01-02 — End: 2016-01-03
  Administered 2016-01-03: 03:00:00 5 mg via ORAL

## 2016-01-02 MED ORDER — lactated ringers infusion
INTRAVENOUS | Status: AC
Start: 2016-01-02 — End: 2016-01-03
  Administered 2016-01-03: 01:00:00 75 mL/h via INTRAVENOUS

## 2016-01-02 MED FILL — INSULIN LISPRO 100 UNIT/ML SUB-Q (NO 2ND CHECK): 100 100 unit/mL | SUBCUTANEOUS | Qty: 3

## 2016-01-02 MED FILL — HEPARIN (PORCINE) 5,000 UNIT/ML INJECTION SOLUTION: 5000 5,000 unit/mL | INTRAMUSCULAR | Qty: 1

## 2016-01-02 MED FILL — CEFAZOLIN 2 GRAM/50 ML IN DEXTROSE (ISO-OSMOTIC) INTRAVENOUS PIGGYBACK: 2 2 gram/50 mL | INTRAVENOUS | Qty: 50

## 2016-01-02 MED FILL — TRAZODONE 100 MG TABLET: 100 100 MG | ORAL | Qty: 1

## 2016-01-02 MED FILL — FAMOTIDINE 20 MG TABLET: 20 20 MG | ORAL | Qty: 1

## 2016-01-02 MED FILL — DOCUSATE SODIUM 100 MG CAPSULE: 100 100 MG | ORAL | Qty: 1

## 2016-01-02 MED FILL — INCRUSE ELLIPTA 62.5 MCG/ACTUATION POWDER FOR INHALATION: 62.5 62.5 mcg/actuation | RESPIRATORY_TRACT | Qty: 7

## 2016-01-02 MED FILL — POLYETHYLENE GLYCOL 3350 17 GRAM ORAL POWDER PACKET: 17 17 gram | ORAL | Qty: 1

## 2016-01-02 MED FILL — ROPINIROLE 1 MG TABLET: 1 1 MG | ORAL | Qty: 1

## 2016-01-02 MED FILL — OXYCODONE 5 MG TABLET: 5 5 MG | ORAL | Qty: 2

## 2016-01-02 MED FILL — TYLENOL 325 MG TABLET: 325 325 mg | ORAL | Qty: 2

## 2016-01-02 MED FILL — MIRTAZAPINE 30 MG TABLET: 30 30 MG | ORAL | Qty: 1

## 2016-01-02 MED FILL — ZOLPIDEM 5 MG TABLET: 5 5 MG | ORAL | Qty: 1

## 2016-01-02 NOTE — Unmapped (Signed)
Anesthesia Transfer of Care Note    Patient: Garrett Reynolds  Procedure(s) Performed: Procedure(s):  PERINEAL URETHROPLASTY WITH CYSTOSCOPY    Patient location: PACU    Anesthesia type: general    Airway Device on Arrival to PACU/ICU: Nasal Cannula    IV Access: Peripheral    Monitors Recommended to be Used During PACU/ICU: Standard Monitors    Outstanding Issues to Address: Hematologic    Level of Consciousness: awake, alert  and oriented    Post vital signs:    Filed Vitals:    01/02/16 1646   BP: 155/75   Pulse: 93   Temp: 97.7 ??F (36.5 ??C)   Resp: 17   SpO2: 100%       Complications: None      Date 01/01/16 1500 - 01/02/16 0659(Not Admitted) 01/02/16 0700 - 01/03/16 0659   Shift 1500-2259 2300-0659 24 Hour Total 0700-1459 1500-2259 2300-0659 24 Hour Total   I  N  T  A  K  E   I.V.    1000  (9.1) 900  (8.2)  1900  (17.3)      Volume (mL) (lactated ringers infusion)    1000 900  1900    Shift Total  (mL/kg)    1000  (9.1) 900  (8.2)  1900  (17.3)   O  U  T  P  U  T   Blood     100  100      Est Blood Loss     100  100    Shift Total  (mL/kg)     100  (0.9)  100  (0.9)   Weight (kg)    109.8 109.8 109.8 109.8

## 2016-01-02 NOTE — Unmapped (Signed)
Anesthesia Extubation Criteria:    Airway Device: endotracheal tube    Emergence Details:      Smooth      _x_      Stormy       __       Prolonged   __     Extubation Criteria:      Motor strength intact       _x_      Follows commands        _x_      Good airway reflexes      _x_      OP suctioned                  _x_        Follows commands:  Yes     Patient extubated:  Yes

## 2016-01-02 NOTE — Unmapped (Signed)
Problem: Inadequate Airway Clearance  Goal: Patient will maintain patent airway  Assess and monitor breath sounds, cough and sputum (if present), and intake/output. Collaborate with respiratory therapy to administer medications and treatments.   Intervention: Encourage incentive spirometry  Volume expansion protocol.

## 2016-01-02 NOTE — Unmapped (Signed)
INTRA-OP POST BRIEFING NOTE: Beaulah Dinning      Specimens:   Specimens     ID Source Type Tests Collected By Collected At Frozen? Attributes Order ID Breast Spec Formalin Marked as Sent    A Other Tissue - SURGICAL PATHOLOGY Clovis Riley, MD PHD 01/02/16 1532  Sent in Formalin [id number]            Prior to leaving the room: Nurse confirmed name of procedure, completion of instrument, sponge & needle counts, reads specimen labels aloud including patient name and addresses any equipment issues? Nurse confirmed wound class. Nurse to surgeon and anesthesia: What are key concerns for recovery and management of the patient?  Yes      Blood products stored at appropriate temperatures prior to return to blood bank (if applicable)? N/A      Patient identification band secured on patient prior to transfer out of the operating room? Yes      Other Comments:     Signed: Elenor Quinones    Date: 01/02/2016    Time: 4:26 PM

## 2016-01-02 NOTE — Unmapped (Signed)
Pt admitted to [facility] A&O x4. VSS on RA. Family at bedside. Scrotal support in place. Foley to bag drainage. IV infusing. Pt reports gradual increase in pain. CLWR. Bed in lowest locked position. Will continue to monitor.

## 2016-01-02 NOTE — Unmapped (Addendum)
[place]  DEPARTMENT OF ANESTHESIOLOGY  PRE-PROCEDURAL EVALUATION    Garrett Reynolds is a 65 y.o. year old male presenting for:    Procedure(s):  PERINEAL URETHROPLASTY WITH POSSIBLE  USE BUCCAL GRAFT OR LINGUAL GRAFT    Surgeon:   Tana Felts, MD PHD    Chief Complaint     Urethral stricture, unspecified stricture type [N35.9]    Review of Systems     Anesthesia Evaluation    Patient summary reviewed and CPC/PAT note reviewed.  All other systems reviewed and are negative.     No history of anesthetic complications   I have reviewed the History and Physical Exam, any relevant changes are noted in the anesthesia pre-operative evaluation.      Cardiovascular:    Exercise tolerance: poor  Duke Met score: 3 - Walking on a flat surface for one or two blocks.Hypertension is well controlled.    (-) CABG/stent, CHF.  ROS comment: HLD - on statin    ECHO 2008  Normal LV function  Bubble study normal without shunt  No regional wall motion abnormalities    Stress 2007  Normal study  Negative for ischemia    Neuro/Muscoloskeletal/Psych:    (+) psychiatric history and depression.  CVA (2008 - no residual symptoms).  (-) seizures.     Pulmonary:    (+) shortness of breath.  Moderate COPD.    ROS comment: Heavy tobacco use.   Smoker's cough.  Unchanged.       GI/Hepatic/Renal:    GERD is well controlled.  Chronic renal disease (History mentioned of CKD III, but previous labs within normal ranges.).    Endo/Other:    Diabetes, well controlled, type 2, using insulin.    (-) no anemia, no thrombocytopenia.       Past Medical History     Past Medical History   Diagnosis Date   ??? Urinary retention    ??? COPD (chronic obstructive pulmonary disease)    ??? Diabetes mellitus    ??? Hypertension    ??? FH: allergy    ??? High cholesterol    ??? Stroke    ??? GERD (gastroesophageal reflux disease)        Past Surgical History     Past Surgical History   Procedure Laterality Date   ??? Hernia repair     ??? Tendon repair     ??? Rotator cuff repair     ???  Cystoscopy N/A 08/01/2015     Procedure: CYSTOSCOPY, RETROGRADE URETHROGRAM, ANTEGRADE URETHROGRAM, POSSIBLE URETHRAL DILATION;  Surgeon: Tana Felts, MD PHD;  Location: [facility] OR;  Service: Urology;  Laterality: N/A;       Family History     History reviewed. No pertinent family history.    Social History     Social History     Social History   ??? Marital Status: Married     Spouse Name: N/A   ??? Number of Children: N/A   ??? Years of Education: N/A     Occupational History   ??? Not on file.     Social History Main Topics   ??? Smoking status: Current Every Day Smoker -- 1.00 packs/day     Types: Cigarettes   ??? Smokeless tobacco: Not on file   ??? Alcohol Use: Yes      Comment: rare   ??? Drug Use: No   ??? Sexual Activity: No     Other Topics Concern   ??? Caffeine Use Yes   ???  Exercise No   ??? Seat Belt Yes     Social History Narrative       Medications     Allergies:  No Known Allergies    Home Meds:  Prior to Admission medications as of 11/09/15 1008   Medication Sig Taking?   albuterol (PROVENTIL;VENTOLIN;PROAIR) 90 mcg/actuation inhaler Inhale 2 puffs into the lungs every 6 hours as needed for Wheezing.    cholecalciferol, vitamin D3, 1000 units tablet Take 1,000 Units by mouth daily.    ciprofloxacin HCl (CIPRO) 500 MG tablet Take 1 tablet (500 mg total) by mouth 2 times a day. Start 7 days prior to surgery    docusate sodium (COLACE) 100 MG capsule Take 100 mg by mouth 2 times a day.    DULoxetine (CYMBALTA) 60 MG capsule Take 60 mg by mouth daily.    insulin aspart (NOVOLOG) 100 unit/mL injection Inject subcutaneously 2 times a day.       insulin glargine (LANTUS) 100 unit/mL injection Inject subcutaneously at bedtime.    liraglutide (VICTOZA 3-PAK) 0.6 mg/0.1 mL (18 mg/3 mL) PnIj Inject subcutaneously.    lisinopril-hydrochlorothiazide (PRINZIDE,ZESTORETIC) 20-12.5 mg per tablet Take 1 tablet by mouth daily.    loratadine (CLARITIN) 10 mg tablet Take 10 mg by mouth daily.    metFORMIN (GLUCOPHAGE) 1000 MG tablet Take 1,000 mg  by mouth 2 times a day with meals.    mirtazapine (REMERON) 30 MG tablet Take 30 mg by mouth at bedtime.    oxyCODONE (ROXICODONE) 5 MG immediate release tablet Take 1 tablet (5 mg total) by mouth every 4 hours as needed for Pain.    polyethylene glycol (GLYCOLAX) 17 gram/dose powder Take 17 g by mouth 2 times a day as needed.    ranitidine (ZANTAC) 150 MG tablet Take 150 mg by mouth 2 times a day.    roPINIRole (REQUIP) 1 MG tablet Take 1 mg by mouth 3 times a day.    rosuvastatin (CRESTOR) 40 MG tablet Take 40 mg by mouth daily.    sildenafil (VIAGRA) 100 MG tablet Take 100 mg by mouth daily as needed for Erectile Dysfunction.    tiotropium (SPIRIVA) 18 mcg Inhale 18 mcg into the lungs daily.    traZODone (DESYREL) 100 MG tablet Take 100 mg by mouth at bedtime.    zolpidem (AMBIEN) 10 mg tablet Take 10 mg by mouth at bedtime as needed for Sleep.        Inpatient Meds:  Scheduled:    Continuous:      PRN: ceFAZolin (ANCEF) IVPB    Vital Signs     Wt Readings from Last 3 Encounters:   01/02/16 242 lb (109.77 kg)   11/09/15 240 lb (108.863 kg)   08/10/15 240 lb (108.863 kg)     Ht Readings from Last 3 Encounters:   01/02/16 6' 2 (1.88 m)   11/09/15 6' 2 (1.88 m)   08/10/15 6' 2 (1.88 m)     Temp Readings from Last 3 Encounters:   01/02/16 98.3 ??F (36.8 ??C) Oral   11/09/15 98.6 ??F (37 ??C) Oral   08/10/15 97.4 ??F (36.3 ??C) Oral     BP Readings from Last 3 Encounters:   01/02/16 146/63   08/10/15 156/81   08/01/15 127/70     Pulse Readings from Last 3 Encounters:   01/02/16 90   11/09/15 80   08/10/15 83     SpO2 Readings from Last 3 Encounters:   01/02/16 96%   08/01/15  98%   07/19/15 95%       Physical Exam     Airway:     Mallampati: I  TM distance: > = 3 FB  Neck ROM: full  Comment: 07/2015:   I.V.; Standard; LMA; 5; 1  (-) neck not short    (+) facial hair     Dental:      (+) upper dentures        Pulmonary:      (+) decreased breath sounds and wheezes.    Cardiovascular:  - normal exam   Rhythm: regular  Rate:  normal  (-) murmur.    Neuro/Musculoskeletal/Psych:    Mental status: alert and oriented to person, place and time.          Abdominal:     Obese.  Abdomen: soft.    Current OB Status:       Other Findings:        Laboratory Data     Lab Results   Component Value Date    WBC 12.0* 07/19/2015    HGB 15.2 07/19/2015    HCT 45.5 07/19/2015    MCV 90.1 07/19/2015    PLT 216 07/19/2015       No results found for: [facility]    Lab Results   Component Value Date    GLUCOSE 223* 07/19/2015    BUN 24 07/19/2015    CO2 28 07/19/2015    CREATININE 0.74 07/19/2015    K 4.0 07/19/2015    NA 138 07/19/2015    CL 103 07/19/2015    CALCIUM 8.9 07/19/2015       No results found for: PTT, INR    No results found for: PREGTESTUR, PREGSERUM, HCG, HCGQUANT    Anesthesia Plan     ASA 3           Anesthesia Type:  general.     (Plan for general anesthesia  PIV x 1  Multimodal Analgesia  PONV prophylaxis  Standard ASA monitoring. )    Intravenous induction.    Anesthetic plan and risks discussed with patient.    Plan, alternatives, and risks of anesthesia, including death, have been explained to and discussed with the patient/legal guardian.  By my assessment, the patient/legal guardian understands and agrees.  Scenario presented in detail.  Questions answered.    Use of blood products discussed with patient whom consented to blood products.   Plan discussed with CRNA and attending.

## 2016-01-02 NOTE — Unmapped (Signed)
PREOPERATIVE DIAGNOSIS:  1.  Bulbar urethral stricture.     POSTOPERATIVE DIAGNOSIS:  1.  Bulbar urethral stricture.     PROCEDURES PERFORMED:   1.  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  2.  Foley catheter placement.     SURGEON: Tana Felts, MD, PhD     RESIDENTS: Erich Montane, Stefan Church, MD     ANESTHESIA:  General.     TUBES:  1.  A 16 French Foley catheter.     ESTIMATED BLOOD LOSS:  100 mL.     COMPLICATIONS:  None.     POSTOPERATIVE CONDITION:  Good.     DISPOSITION:  Admit to urology service.     INDICATIONS FOR PROCEDURE:  The patient is a 65 y.o. male with a history of bulbar urethral stricture which failed initial attempt at dilation. A retrograde urethrogram showed a 1 cm bulbar urethral stricture. Risks, benefits and alternatives to urethroplasty including the risk of bleeding, infection, rectal injury, anal sphincter injury, stricture recurrence, penile shortening, curvature, and/or impotence were explained to the patient and he provided informed consent.     OPERATIVE  FINDINGS:  Approximately 1 cm long bulbar urethral stricture.     DETAILS OF PROCEDURE:  DETAILS OF PROCEDURE:  After informed consent was obtained, the patient was  brought back to the Operative Room by anesthesia staff. After induction of general anesthesia, he was placed in the lithotomy position. His perineum and genitalia were shaved, prepped and draped in standard sterile fashion. Timeout was performed.     A vertical incision was made overlying the perineal raphe.  This incision was  made from the penoscrotal junction until about 1 cm anterior to the anal  verge.  Electrocautery was used to dissect down to the  bulbospongiosus muscle.  The bulbospongiosus muscle was then split with  electrocautery and the urethra was visualized.  The muscle was cleared off of  the ventral surface of the urethra.  The urethra was dissected free of its  lateral attachments and circumferential  mobilization was done.  The urethra was mobilized both proximally and distally.     After mobilization of the urethra, cystoscope was advanced into the penis. The distal part of the stricture was recognized. A 3-0 Vicryl was placed through the urethra to mark this point. A pollack catheter was advanced through the strictured segment. An 24-Fr catheter was passed through the external urethral meatus to the point of the stricture. The urethra was incised vertically on ventral surface over top of the catheter until the lumen was seen. With the pollack catheter visualized, Metzenbaum scissors were used to cut along the stricture more proximally until healthy tissue was seen. The strictured segment was the excised. The proximal and distal ends of urethra were brought together in a tension-free manner and no graft was needed. The cystoscope was advanced into the proximal urethra to the bladder. The remainder of the urethra was normal.    The urethra was then spatulated ventrally in the distal aspect and dorsally in the proximal aspect. After ensuring hemostasis, we then proceeded to perform the anastomosis of  the urethra.  This was done using 5-0 PDS running sutures on the posterior part and then on the anterior urethra with a mucosa to mucosa apposition. The  anastomosis was tension free. The anastomosis was reinforced on the ventral aspect with 3-0 Vicryl interrupted sutures. An 18-Fr Foley catheter was passed through the external urethral meatus across the anastomosis  and this went in without difficulty. The wound was copiously irrigated and all bleeding points were controlled.    The wound was then closed in multiple layers, using 3-0 Vicryl running suture for the bulbospongiosus muscle, 3-0 Vicryl running suture for the superficial fascia, 3-0 Vicryl  interrupted stitches for the sub-cautaneous tissue and 4-0 Monocryl closure for skin. The patient was cleaned and dried,  repositioned in the supine posture after  application of scrotal fluffs and scrotal support. The foley was secured with stat lock. The patient was extubated uneventfully and was transferred to PACU in stable condition.     Dr. Ree Shay was present for the entire duration of the case.    Stefan Church, MD  01/02/16      I was present for the entire procedure and findings were discussed and agreed with resident as documented above.  Gwenlyn Perking [place]

## 2016-01-02 NOTE — Unmapped (Signed)
Anesthesia Post Note    Patient: Garrett Reynolds    Procedure(s) Performed: Procedure(s):  PERINEAL URETHROPLASTY WITH CYSTOSCOPY    Anesthesia type: general    Patient location: PACU    Post pain: Adequate analgesia    Post assessment: tolerated procedure well    Last Vitals:   Filed Vitals:    01/02/16 1656 01/02/16 1658 01/02/16 1713 01/02/16 1728   BP: 130/71 130/71 158/71 126/61   Pulse: 100 103 100 94   Temp:    97 ??F (36.1 ??C)   TempSrc:    Axillary   Resp: 17 18 14 13    Height:       Weight:       SpO2: 97% 97% 90% 94%        Post vital signs: stable    Level of consciousness: awake    Complications: None

## 2016-01-02 NOTE — Unmapped (Signed)
UROLOGY H&P NOTE    Patient: Axzel Rockhill  Admit Date: (Not on file)  Location: Room/bed info not found    Chief Complaint: urethral stricture    HPI: Dawud Mays is a 65 y.o. male [facility] with hx of DM and COPD who initially presented for evaluation of urethral stricture.  Sticture was dilated in 07/2015, however it recurred by January 2017.  He is here for definitive operative repair.     Medical History:  Past Medical History   Diagnosis Date   ??? Urinary retention    ??? COPD (chronic obstructive pulmonary disease)    ??? Diabetes mellitus    ??? Hypertension    ??? FH: allergy    ??? High cholesterol    ??? Stroke    ??? GERD (gastroesophageal reflux disease)        Past Surgical History   Procedure Laterality Date   ??? Hernia repair     ??? Tendon repair     ??? Rotator cuff repair     ??? Cystoscopy N/A 08/01/2015     Procedure: CYSTOSCOPY, RETROGRADE URETHROGRAM, ANTEGRADE URETHROGRAM, POSSIBLE URETHRAL DILATION;  Surgeon: Tana Felts, MD PHD;  Location: [facility] OR;  Service: Urology;  Laterality: N/A;       Medications:   Outpatient Meds:  Previous Medications    ALBUTEROL (PROVENTIL;VENTOLIN;PROAIR) 90 MCG/ACTUATION INHALER    Inhale 2 puffs into the lungs every 6 hours as needed for Wheezing.    CHOLECALCIFEROL, VITAMIN D3, 1000 UNITS TABLET    Take 1,000 Units by mouth daily.    CIPROFLOXACIN HCL (CIPRO) 500 MG TABLET    Take 1 tablet (500 mg total) by mouth 2 times a day. Start 7 days prior to surgery    DOCUSATE SODIUM (COLACE) 100 MG CAPSULE    Take 100 mg by mouth 2 times a day.    DULOXETINE (CYMBALTA) 60 MG CAPSULE    Take 60 mg by mouth daily.    INSULIN ASPART (NOVOLOG) 100 UNIT/ML INJECTION    Inject subcutaneously 2 times a day.       INSULIN GLARGINE (LANTUS) 100 UNIT/ML INJECTION    Inject subcutaneously at bedtime.    LIRAGLUTIDE (VICTOZA 3-PAK) 0.6 MG/0.1 ML (18 MG/3 ML) PNIJ    Inject subcutaneously.    LISINOPRIL-HYDROCHLOROTHIAZIDE (PRINZIDE,ZESTORETIC) 20-12.5 MG PER TABLET    Take 1 tablet by mouth daily.     LORATADINE (CLARITIN) 10 MG TABLET    Take 10 mg by mouth daily.    METFORMIN (GLUCOPHAGE) 1000 MG TABLET    Take 1,000 mg by mouth 2 times a day with meals.    MIRTAZAPINE (REMERON) 30 MG TABLET    Take 30 mg by mouth at bedtime.    OXYCODONE (ROXICODONE) 5 MG IMMEDIATE RELEASE TABLET    Take 1 tablet (5 mg total) by mouth every 4 hours as needed for Pain.    POLYETHYLENE GLYCOL (GLYCOLAX) 17 GRAM/DOSE POWDER    Take 17 g by mouth 2 times a day as needed.    RANITIDINE (ZANTAC) 150 MG TABLET    Take 150 mg by mouth 2 times a day.    ROPINIROLE (REQUIP) 1 MG TABLET    Take 1 mg by mouth 3 times a day.    ROSUVASTATIN (CRESTOR) 40 MG TABLET    Take 40 mg by mouth daily.    SILDENAFIL (VIAGRA) 100 MG TABLET    Take 100 mg by mouth daily as needed for Erectile Dysfunction.    TIOTROPIUM (  SPIRIVA) 18 MCG    Inhale 18 mcg into the lungs daily.    TRAZODONE (DESYREL) 100 MG TABLET    Take 100 mg by mouth at bedtime.    ZOLPIDEM (AMBIEN) 10 MG TABLET    Take 10 mg by mouth at bedtime as needed for Sleep.       Inpatient Meds:  Scheduled:  Continuous:  PRN:    Allergies: No Known Allergies    SH:   Social History   Substance Use Topics   ??? Smoking status: Current Every Day Smoker -- 1.00 packs/day     Types: Cigarettes   ??? Smokeless tobacco: Not on file   ??? Alcohol Use: Yes      Comment: rare       FH: Noncontributory.    ROS: As per HPI. Otherwise negative.    Objective:  There were no vitals filed for this visit.     Physical Examination:  Gen: No apparent distress, alert and oriented x3  HEENT: Normocephalic, EOMI, mucous membranes pink and moist  CV: RRR  Chest: No respiratory distress  Abd: Soft, non-tender, non-distended   Ext: Warm and well perfused  Neuro: No focal deficits    Labs:  No results for input(s): WBC, HGB, HCT, PLT in the last 72 hours.  No results for input(s): NA, K, CL, CO2, BUN, CREATININE, GLUCOSE, CALCIUM, MG, PHOS in the last 72 hours.  No results for input(s): INR, PROTIME in the last 72  hours.  No results found for: PSA    No results found for: COLORU, CLARITYU, PH, PROTEINUA, PHUR, LABSPEC, GLUCOSEU, BLOODU, LEUKOCYTESUR, NITRITE, BILIRUBINUR, UROBILINOGEN, RBCUA, WBCUA, BACTERIA, AMORPHOUS, CRYSTAL, CASTS    Imaging:   No results found.    Assessment:  Heberto Sturdevant is a 65 y.o. male with bulbar urethral stricture    Plan:  To OR for perineal urethroplasty with possible use of buccal mucosal graft  24 hour observation planned    Lyn Henri, MD  01/02/2016

## 2016-01-03 LAB — BASIC METABOLIC PANEL
Anion Gap: 6 mmol/L (ref 3–16)
BUN: 28 mg/dL (ref 7–25)
CO2: 27 mmol/L (ref 21–33)
Calcium: 8.8 mg/dL (ref 8.6–10.3)
Chloride: 103 mmol/L (ref 98–110)
Creatinine: 1.13 mg/dL (ref 0.60–1.30)
Glucose: 198 mg/dL (ref 70–100)
Osmolality, Calculated: 293 mOsm/kg (ref 278–305)
Potassium: 5.2 mmol/L (ref 3.5–5.3)
Sodium: 136 mmol/L (ref 133–146)
eGFR AA CKD-EPI: 79 See note.
eGFR NONAA CKD-EPI: 68 See note.

## 2016-01-03 LAB — POC GLU MONITORING DEVICE
POC Glucose Monitoring Device: 186 mg/dL (ref 70–100)
POC Glucose Monitoring Device: 200 mg/dL (ref 70–100)

## 2016-01-03 LAB — CBC
Hematocrit: 40.8 % (ref 38.5–50.0)
Hemoglobin: 13.4 g/dL (ref 13.2–17.1)
MCH: 29.3 pg (ref 27.0–33.0)
MCHC: 32.8 g/dL (ref 32.0–36.0)
MCV: 89.2 fL (ref 80.0–100.0)
MPV: 8.2 fL (ref 7.5–11.5)
Platelets: 191 10*3/uL (ref 140–400)
RBC: 4.58 10*6/uL (ref 4.20–5.80)
RDW: 14.5 % (ref 11.0–15.0)
WBC: 16.4 10*3/uL (ref 3.8–10.8)

## 2016-01-03 LAB — HEMOGLOBIN A1C: Hemoglobin A1C: 7.6 % (ref 4.8–6.4)

## 2016-01-03 MED ORDER — cephALEXin (KEFLEX) 500 MG capsule
500 | ORAL_CAPSULE | Freq: Two times a day (BID) | ORAL | Status: AC
Start: 2016-01-03 — End: 2016-01-10

## 2016-01-03 MED ORDER — oxyCODONE (ROXICODONE) 5 MG immediate release tablet
5 | ORAL_TABLET | ORAL | 0.00 refills | 6.00000 days | Status: AC | PRN
Start: 2016-01-03 — End: 2016-01-24

## 2016-01-03 MED FILL — HEPARIN (PORCINE) 5,000 UNIT/ML INJECTION SOLUTION: 5000 5,000 unit/mL | INTRAMUSCULAR | Qty: 1

## 2016-01-03 MED FILL — DULOXETINE 60 MG CAPSULE,DELAYED RELEASE: 60 60 MG | ORAL | Qty: 1

## 2016-01-03 MED FILL — OXYCODONE 5 MG TABLET: 5 5 MG | ORAL | Qty: 2

## 2016-01-03 MED FILL — DOCUSATE SODIUM 100 MG CAPSULE: 100 100 MG | ORAL | Qty: 1

## 2016-01-03 MED FILL — LORATADINE 10 MG TABLET: 10 10 mg | ORAL | Qty: 1

## 2016-01-03 MED FILL — TRAZODONE 100 MG TABLET: 100 100 MG | ORAL | Qty: 1

## 2016-01-03 MED FILL — POLYETHYLENE GLYCOL 3350 17 GRAM ORAL POWDER PACKET: 17 17 gram | ORAL | Qty: 1

## 2016-01-03 MED FILL — FAMOTIDINE 20 MG TABLET: 20 20 MG | ORAL | Qty: 1

## 2016-01-03 MED FILL — ROPINIROLE 1 MG TABLET: 1 1 MG | ORAL | Qty: 1

## 2016-01-03 MED FILL — MIRTAZAPINE 30 MG TABLET: 30 30 MG | ORAL | Qty: 1

## 2016-01-03 MED FILL — LISINOPRIL 20 MG TABLET: 20 20 MG | ORAL | Qty: 1

## 2016-01-03 MED FILL — TYLENOL 325 MG TABLET: 325 325 mg | ORAL | Qty: 2

## 2016-01-03 MED FILL — CRESTOR 40 MG TABLET: 40 40 mg | ORAL | Qty: 1

## 2016-01-03 NOTE — Unmapped (Signed)
UROLOGY DISCHARGE SUMMARY    Patient ID:  Garrett Reynolds  [id number]  13-Dec-1950    Admit date: 01/02/2016    Discharge date: 01/03/2016    Attending Physician: Tana Felts, MD PHD     Admission Diagnoses:   Urethral stricture, unspecified stricture type [N35.9]    Discharge Diagnoses:   Principal Problem:    Urethral stricture    Past Medical History   Diagnosis Date   ??? Urinary retention    ??? COPD (chronic obstructive pulmonary disease)    ??? Diabetes mellitus    ??? Hypertension    ??? FH: allergy    ??? High cholesterol    ??? Stroke    ??? GERD (gastroesophageal reflux disease)        Indication for Admission: Garrett Reynolds is a 65 y.o. male with a history of COPD, DM, HTN, stroke, and urinary retention who presents for surgical management of urethral stricture requiring overnight admission for medical observation and treatment.     Operations/Procedures Performed:   1.?? 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  2.?? Foley catheter placement.    Hospital Course: Patient admitted on 01/02/2016 and underwent abovementioned procedure(s) on 01/02/2016. Tolerated the procedure well with no complications. Please see full operative report for further details regarding the operation. Postoperatilvely transferred to PACU, then to the floor in stable condition. Pain controlled post-op with IV and oral medications. Foley remained throughout admission and on discharge. UOP adequate. Diet was advanced and tolerated this well. At time of discharge, the patient was tolerating oral food and hydration, voiding via foley catheter, had return of bowel function, was ambulating without difficulty, and pain was controlled on oral medications. The patient was determined to be suitable for discharge and the patient felt comfortable with that decision. The patient was discharged with pain medications and antibiotics and foley supplies. Care instructions with return precautions discussed. He will follow up in 3 weeks  with Maryjean Ka following retrograde urethrogram, and with Dr. Ree Shay in 3 months for cystoscopy.     Consults: None    Significant Diagnostic Studies: Surgical pathology (pending)    Discharge Exam:  Blood pressure 122/62, pulse 84, temperature 97.3 ??F (36.3 ??C), temperature source Oral, resp. rate 18, height 6' 2 (1.88 m), weight 242 lb (109.77 kg), SpO2 93 %.    Gen - NAD, AOx3  CV - RRR  Resp - CTAB  Abd - soft, NT/ND  Ext - warm, well-perfused  GU - perineal incision c/d/i, foley catheter intact with clear, yellow urine draining into bag    Disposition: Discharged home in good condition    Discharge Medications:  Current Discharge Medication List      START taking these medications    Details   cephALEXin (KEFLEX) 500 MG capsule Take 1 capsule (500 mg total) by mouth 2 times a day for 7 days.  Qty: 14 capsule, Refills: 0         CONTINUE these medications which have CHANGED    Details   oxyCODONE (ROXICODONE) 5 MG immediate release tablet Take 1 tablet (5 mg total) by mouth every 4 hours as needed.  Qty: 30 tablet, Refills: 0         CONTINUE these medications which have NOT CHANGED    Details   albuterol (PROVENTIL;VENTOLIN;PROAIR) 90 mcg/actuation inhaler Inhale 2 puffs into the lungs every 6 hours as needed for Wheezing.      cholecalciferol, vitamin D3, 1000 units tablet Take 1,000  Units by mouth daily.      docusate sodium (COLACE) 100 MG capsule Take 100 mg by mouth 2 times a day.      DULoxetine (CYMBALTA) 60 MG capsule Take 60 mg by mouth daily.      insulin aspart (NOVOLOG) 100 unit/mL injection Inject subcutaneously 2 times a day.         insulin glargine (LANTUS) 100 unit/mL injection Inject subcutaneously at bedtime.      liraglutide (VICTOZA 3-PAK) 0.6 mg/0.1 mL (18 mg/3 mL) PnIj Inject subcutaneously.      lisinopril-hydrochlorothiazide (PRINZIDE,ZESTORETIC) 20-12.5 mg per tablet Take 1 tablet by mouth daily.      loratadine (CLARITIN) 10 mg tablet Take 10 mg by mouth daily.      metFORMIN  (GLUCOPHAGE) 1000 MG tablet Take 1,000 mg by mouth 2 times a day with meals.      mirtazapine (REMERON) 30 MG tablet Take 30 mg by mouth at bedtime.      roPINIRole (REQUIP) 1 MG tablet Take 1 mg by mouth 3 times a day.      rosuvastatin (CRESTOR) 40 MG tablet Take 40 mg by mouth daily.      tiotropium (SPIRIVA) 18 mcg Inhale 18 mcg into the lungs daily.      traZODone (DESYREL) 100 MG tablet Take 100 mg by mouth at bedtime.      zolpidem (AMBIEN) 10 mg tablet Take 10 mg by mouth at bedtime as needed for Sleep.      polyethylene glycol (GLYCOLAX) 17 gram/dose powder Take 17 g by mouth 2 times a day as needed.  Qty: 255 g, Refills: 0      ranitidine (ZANTAC) 150 MG tablet Take 150 mg by mouth 2 times a day.      sildenafil (VIAGRA) 100 MG tablet Take 100 mg by mouth daily as needed for Erectile Dysfunction.         STOP taking these medications       ciprofloxacin HCl (CIPRO) 500 MG tablet Comments:   Reason for Stopping:               Patient Instructions:  Activity:    Do not lift more than 10 pounds for 6 weeks post-operatively    Return to work:   May return to work as tolerated    Diet:   Resume home diet    Dressing/Wound Care:  Your wound is dressed with dermabond glue, it will dissolve gradually over 7-10 days    Continue to wear scrotal support with gauze dressing for one week    Change gauze daily after shower    May shower on day of discharge. Wash incision gently with soap and water and pat dry. Do not soak incisions in bath water or swim for two weeks    May use small leg bag for urine collection during daytime hours    Other Instructions:   Take medications as prescribed. No driving while taking narcotic pain medications.    Go to the ER or call the Urology Resident on call at [phone] if you experience any of the following:  - Worsening pain, nausea, or vomiting not relieved by medications  - Temperature greater than 101.5 F or fever/chills  - Redness around incision, drainage from incision, or  wound edge separation  - Chest pain, shortness of breath, persistent dizziness, swelling in one or both legs    Foley Catheter Care:   The tube draining your urine is called a Foley Catheter.  The catheter is held in place by a balloon inside your bladder. Urine will drain continuously from the bladder into a bag. At night, you will wear a larger drainage bag to collect the urine. Keep this bag in a container, such as a plastic waste basket, in case accidental leakage. During the day, you may wear a smaller leg bag that fits on your outer thigh. This smaller bag is discrete and can be worn under loose fitting clothes. The smaller bag will fill quickly and should be emptied every few hours.    Note: It is not unusual for urine to leak around the Foley catheter. If leakage is severe, or if leakage is accompanied by spasms in the bladder (a sharp pain/pressure in the pubic area followed by urinary leakage), a bladder antispasmodic may be called into your pharmacy. Please contact the office during normal business hours if you would like this anti-spasm medication called in. Please be aware that such antispasmodics can be constipating, so a stool softner is recommended as well.    To empty the leg bag, first wash your hands. Remove the cap on the bottom of the leg bag, and then turn the valve to empty. When completed, close the valve, replace the cap and wash your hands. Follow the same directions to drain the larger bag. To change from one bag to another, first wash your hands. Empty the bag; carefully separate the catheter tubing from the tubing on the bag (note the two different colors that can help you distinguish where the tubing separates). Next, connect the catheter to the new drainage tubing. Try not to touch the tip of the catheter or the tip of the drainage tubing If possible. When finished, wash your hands again. Taping the catheter to your thigh will lessen the discomfort from the catheter. To clean the  urinary collection bag, use warm soapy water, rinse clear, then clean with a solution of one tablespoon of vinegar to one quart of water and again rinse clear. Be sure to leave the drainage spout open while hanging the bag to dry.    Follow-Up:  Maryjean Ka in 3 weeks (with RUG)  Dr. Ree Shay in 3 months (cystoscopy)    Vinh Sachs Tamsen Roers  01/03/2016

## 2016-01-03 NOTE — Unmapped (Signed)
Urology Progress Note    Patient: Garrett Reynolds  Admit Date: 01/02/2016  OR Date: 01/02/2016    Subjective:  Patient seen and examined. No acute events overnight.  Pain well controlled.    Objective:  Vitals:  Temp:  [97 ??F (36.1 ??C)-98.3 ??F (36.8 ??C)] 97.3 ??F (36.3 ??C)  Heart Rate:  [65-103] 84  Resp:  [13-24] 18  BP: (115-158)/(61-78) 122/62 mmHg  FiO2:  [56 %-100 %] 100 %      Date 01/02/16 0700 - 01/03/16 0659 01/03/16 0700 - 01/04/16 0659   Shift 0700-1459 1500-2259 2300-0659 24 Hour Total 0700-1459 1500-2259 2300-0659 24 Hour Total   I  N  T  A  K  E   P.O.  480 240 720          P.O.  480 240 720        I.V.  (mL/kg) 1000  (9.1) 900  (8.2) 529  (4.8) 2429  (22.1)          Volume (mL) (lactated ringers infusion) 1000 900  1900          Volume (mL) (lactated ringers infusion)   529 529        IV Piggyback  50  50          Volume (mL) (ceFAZolin (ANCEF) IVPB 2 g in D5W (duplex))  50  50        Shift Total  (mL/kg) 1000  (9.1) 1430  (13) 769  (7) 3199  (29.1)       O  U  T  P  U  T   Urine  (mL/kg/hr)  250  (0.3) 250  (0.3) 500  (0.2)          Urine  150  150          Output (mL) (IUC (Foley) Non-latex 16 Fr.)  100 250 350        Blood  100  100          Est Blood Loss  100  100        Shift Total  (mL/kg)  350  (3.2) 250  (2.3) 600  (5.5)       Weight (kg) 109.8 109.8 109.8 109.8 109.8 109.8 109.8 109.8       Physical Exam:   Gen: NAD, AAOx3  CV: RRR, distal pulses intact  Resp: CTAB, no respiratory distress  Abd: Soft, NT/ND, incision c/d/i  Ext: Warm and well perfused  GU: Foley in place draining clear yellow urine    Labs:  Recent Labs      01/03/16   0655   WBC  16.4*   HGB  13.4   HCT  40.8   PLT  191     Recent Labs      01/03/16   0655   NA  136   K  5.2   CL  103   CO2  27   BUN  28*   CREATININE  1.13   GLUCOSE  198*   CALCIUM  8.8       Current Medications:  Scheduled Meds:  ??? docusate sodium  100 mg Oral BID   ??? DULoxetine  60 mg Oral Daily 0900   ??? famotidine  20 mg Oral BID   ??? heparin (porcine)  5,000  Units Subcutaneous Q8H   ??? insulin lispro  0-8 Units Subcutaneous TID AC   ??? lisinopril  20 mg Oral Daily 0900   ???  loratadine  10 mg Oral Daily 0900   ??? mirtazapine  30 mg Oral Nightly (2100)   ??? polyethylene glycol  17 g Oral BID   ??? roPINIRole  1 mg Oral TID   ??? rosuvastatin  40 mg Oral Daily 0900   ??? traZODone  100 mg Oral Nightly (2100)   ??? umeclidinium  62.5 mcg Inhalation RT Daily     Continuous Infusions:  ??? lactated ringers 75 mL/hr (01/02/16 1959)     PRN Meds: acetaminophen, albuterol, dextrose 50 % in water (D50W), diphenhydrAMINE **OR** diphenhydrAMINE, glucose, HYDROmorphone **OR** HYDROmorphone, ondansetron **OR** ondansetron, oxyCODONE **OR** oxyCODONE, zolpidem    Assessment:  Garrett Reynolds is a 65 y.o. male with urethral stricture disease s/p perineal urethroplasty on 01/02/2016    Plan:  GU/Renal: Adequate UOP, Cr stable, Foley catheter to remain in place for 3 weeks with RUG around the catheter prior to removal  Neuro: Pain controlled with prn PO/IV medication  CV: Hemodynamically stable  Pulm: Wean O2 as tolerated, encourage IS  FEN/GI: Nutrition: Regular diet. IVF off. Bowel regimen and electrolyte replacement prn  Heme: Hgb stable  ID: No concerns for infection. Antibiotics: Keflex x 7 days  Ppx: SQH, SCD, OOB/ambulate  Dispo: Discharge home, follow up in 3 weeks with RUG around the catheter prior.    Garrett Henri, MD  Urology   Pager: [phone]

## 2016-01-03 NOTE — Unmapped (Signed)
Activity:    Do not lift more than 10 pounds for 6 weeks post-operatively    Return to work:   May return to work as tolerated    Diet:   Resume home diet    Dressing/Wound Care:  Your wound is dressed with dermabond glue, it will dissolve gradually over 7-10 days    Continue to wear scrotal support with gauze dressing for one week    Change gauze daily after shower    May shower on day of discharge. Wash incision gently with soap and water and pat dry. Do not soak incisions in bath water or swim for two weeks    May use small leg bag for urine collection during daytime hours    Other Instructions:   Take medications as prescribed. No driving while taking narcotic pain medications.    Go to the ER or call the Urology Resident on call at [phone] if you experience any of the following:  - Worsening pain, nausea, or vomiting not relieved by medications  - Temperature greater than 101.5 F or fever/chills  - Redness around incision, drainage from incision, or wound edge separation  - Chest pain, shortness of breath, persistent dizziness, swelling in one or both legs    Foley Catheter Care:   The tube draining your urine is called a Foley Catheter. The catheter is held in place by a balloon inside your bladder. Urine will drain continuously from the bladder into a bag. At night, you will wear a larger drainage bag to collect the urine. Keep this bag in a container, such as a plastic waste basket, in case accidental leakage. During the day, you may wear a smaller leg bag that fits on your outer thigh. This smaller bag is discrete and can be worn under loose fitting clothes. The smaller bag will fill quickly and should be emptied every few hours.    Note: It is not unusual for urine to leak around the Foley catheter. If leakage is severe, or if leakage is accompanied by spasms in the bladder (a sharp pain/pressure in the pubic area followed by urinary leakage), a bladder antispasmodic may be called into your pharmacy.  Please contact the office during normal business hours if you would like this anti-spasm medication called in. Please be aware that such antispasmodics can be constipating, so a stool softner is recommended as well.    To empty the leg bag, first wash your hands. Remove the cap on the bottom of the leg bag, and then turn the valve to empty. When completed, close the valve, replace the cap and wash your hands. Follow the same directions to drain the larger bag. To change from one bag to another, first wash your hands. Empty the bag; carefully separate the catheter tubing from the tubing on the bag (note the two different colors that can help you distinguish where the tubing separates). Next, connect the catheter to the new drainage tubing. Try not to touch the tip of the catheter or the tip of the drainage tubing If possible. When finished, wash your hands again. Taping the catheter to your thigh will lessen the discomfort from the catheter. To clean the urinary collection bag, use warm soapy water, rinse clear, then clean with a solution of one tablespoon of vinegar to one quart of water and again rinse clear. Be sure to leave the drainage spout open while hanging the bag to dry.

## 2016-01-24 ENCOUNTER — Inpatient Hospital Stay: Admit: 2016-01-24 | Payer: PRIVATE HEALTH INSURANCE

## 2016-01-24 ENCOUNTER — Ambulatory Visit: Admit: 2016-01-24 | Discharge: 2016-01-24 | Payer: PRIVATE HEALTH INSURANCE

## 2016-01-24 DIAGNOSIS — N359 Urethral stricture, unspecified: Secondary | ICD-10-CM

## 2016-01-24 MED ORDER — OMNIPAQUE 300 mg/mL (iohexol) 100 mL
300 | Freq: Once | INTRAVENOUS | Status: AC | PRN
Start: 2016-01-24 — End: 2016-01-24
  Administered 2016-01-24: 15:00:00 100 mL via URETHRAL

## 2016-01-24 MED ORDER — oxyCODONE (ROXICODONE) 5 MG immediate release tablet
5 | ORAL_TABLET | Freq: Four times a day (QID) | ORAL | 0.00 refills | 6.00000 days | Status: AC | PRN
Start: 2016-01-24 — End: ?

## 2016-01-24 MED FILL — OMNIPAQUE 300 MG IODINE/ML INTRAVENOUS SOLUTION: 300 300 mg iodine/mL | INTRAVENOUS | Qty: 100

## 2016-01-24 NOTE — Unmapped (Signed)
Keep the catheter in for now  Will refill the Oxycodone once  Follow up in 1 week   Will discuss with Dr Ree Shay related to repeating the imaging

## 2016-01-24 NOTE — Unmapped (Signed)
Chief Complaint   Patient presents with   ??? Urethroplasty     Cath Removal?          History of Present Illness  Post-Op Visit:    Procedure:    Urethral: Perineal stricture Urothroplasty  Garrett Reynolds is an 65 y.o. White or Caucasian male who presents for post op follow up after RUG. Unable to see complete passage of die through the urethra but dye is visible in the bladder. Increased pain at the incision but clean dry and intact. Perineal Urethroplasty with Cystoscopy on 3/8 by Dr Tana Felts.     Complaints:  pain  Wound/Incision:  clean & dry  Sutures/Staples:  left in  Dressing:  instruction given  Instructions Given:  exercise instructions  Restrictions:  lifting and exercise          Review of Systems   Constitutional: Negative for fever, chills and fatigue.   Gastrointestinal: Negative for diarrhea and constipation.   Genitourinary: Positive for difficulty urinating.   All other systems reviewed and are negative.      Allergies  Review of patient's allergies indicates no known allergies.    Medications  Outpatient Encounter Prescriptions as of 01/24/2016   Medication Sig Dispense Refill   ??? albuterol (PROVENTIL;VENTOLIN;PROAIR) 90 mcg/actuation inhaler Inhale 2 puffs into the lungs every 6 hours as needed for Wheezing.     ??? cholecalciferol, vitamin D3, 1000 units tablet Take 1,000 Units by mouth daily.     ??? docusate sodium (COLACE) 100 MG capsule Take 100 mg by mouth 2 times a day.     ??? DULoxetine (CYMBALTA) 60 MG capsule Take 60 mg by mouth daily.     ??? insulin aspart (NOVOLOG) 100 unit/mL injection Inject subcutaneously 2 times a day.        ??? insulin glargine (LANTUS) 100 unit/mL injection Inject subcutaneously at bedtime.     ??? liraglutide (VICTOZA 3-PAK) 0.6 mg/0.1 mL (18 mg/3 mL) PnIj Inject subcutaneously.     ??? lisinopril-hydrochlorothiazide (PRINZIDE,ZESTORETIC) 20-12.5 mg per tablet Take 1 tablet by mouth daily.     ??? loratadine (CLARITIN) 10 mg tablet Take 10 mg by mouth daily.     ??? metFORMIN  (GLUCOPHAGE) 1000 MG tablet Take 1,000 mg by mouth 2 times a day with meals.     ??? mirtazapine (REMERON) 30 MG tablet Take 30 mg by mouth at bedtime.     ??? oxyCODONE (ROXICODONE) 5 MG immediate release tablet Take 1 tablet (5 mg total) by mouth every 6 hours as needed. 30 tablet 0   ??? polyethylene glycol (GLYCOLAX) 17 gram/dose powder Take 17 g by mouth 2 times a day as needed. 255 g 0   ??? ranitidine (ZANTAC) 150 MG tablet Take 150 mg by mouth 2 times a day.     ??? roPINIRole (REQUIP) 1 MG tablet Take 1 mg by mouth 3 times a day.     ??? rosuvastatin (CRESTOR) 40 MG tablet Take 40 mg by mouth daily.     ??? sildenafil (VIAGRA) 100 MG tablet Take 100 mg by mouth daily as needed for Erectile Dysfunction.     ??? tiotropium (SPIRIVA) 18 mcg Inhale 18 mcg into the lungs daily.     ??? traZODone (DESYREL) 100 MG tablet Take 100 mg by mouth at bedtime.     ??? zolpidem (AMBIEN) 10 mg tablet Take 10 mg by mouth at bedtime as needed for Sleep.     ??? [DISCONTINUED] oxyCODONE (ROXICODONE) 5 MG immediate  release tablet Take 1 tablet (5 mg total) by mouth every 4 hours as needed. 30 tablet 0     Facility-Administered Encounter Medications as of 01/24/2016   Medication Dose Route Frequency Provider Last Rate Last Dose   ??? [COMPLETED] OMNIPAQUE 300 mg/mL (iohexol) 100 mL  100 mL Urethral ONCE PRN Jeanette Caprice, CNP   100 mL at 01/24/16 1110        Histories  He has a past medical history of Urinary retention; COPD (chronic obstructive pulmonary disease); Diabetes mellitus; Hypertension; FH: allergy; High cholesterol; Stroke; and GERD (gastroesophageal reflux disease).    He has past surgical history that includes Hernia repair; Tendon repair; Rotator cuff repair; Cystoscopy (N/A, 08/01/2015); and Urethroplasty (N/A, 01/02/2016).    His family history is not on file.    He reports that he has been smoking Cigarettes.  He has been smoking about 1.00 pack per day. He does not have any smokeless tobacco history on file. He reports that he  drinks alcohol. He reports that he does not use illicit drugs.    The following portions of the patient's history were reviewed and updated as appropriate: allergies, current medications, past family history, past medical history, past social history, past surgical history and problem list.    Blood pressure 140/78, pulse 91, resp. rate 16, height 6' 2 (1.88 m), weight 242 lb (109.77 kg), SpO2 94 %.  Physical Exam   Nursing note and vitals reviewed.  Constitutional: He is oriented to person, place, and time. He appears well-developed and well-nourished. No distress.   HENT:   Head: Normocephalic and atraumatic.   Eyes: Pupils are equal, round, and reactive to light.   Neck: Normal range of motion.   Cardiovascular: Normal rate.    Pulmonary/Chest: Effort normal.   Abdominal: Soft.   Musculoskeletal: Normal range of motion.   Neurological: He is alert and oriented to person, place, and time.   Skin: Skin is warm and dry. He is not diaphoretic.   Psychiatric: He has a normal mood and affect. His behavior is normal. Judgment and thought content normal.            Assessment  Garrett Reynolds is an 65 y.o. White or Caucasian male who presents for post op follow up after RUG. Unable to see complete passage of die through the urethra but dye is visible in the bladder. Increased pain at the incision but clean dry and intact. Perineal Urethroplasty with Cystoscopy on 3/8 by Dr Tana Felts.   Discussed the RIG results with Dr Ree Shay. Plan to removed the catheter in a week,      Plan  Follow up in one week for catheter removal  Refill Oxycodone  OARRS report run.          Medical Decision Making  The following items were considered in medical decision making:  Discussion of patient care with other providers  Review / order clinical lab tests  Review / order radiology tests  Review / order other diagnostic tests/interventions

## 2016-01-31 ENCOUNTER — Ambulatory Visit: Admit: 2016-01-31 | Discharge: 2016-01-31 | Payer: PRIVATE HEALTH INSURANCE

## 2016-01-31 DIAGNOSIS — N359 Urethral stricture, unspecified: Secondary | ICD-10-CM

## 2016-01-31 NOTE — Unmapped (Signed)
No chief complaint on file.         History of Present Illness  Garrett Reynolds is an 65 y.o. White or Caucasian male who presents for  Catheter removal after Urethroplasty      Last visit note from 01/24/2016  Garrett Reynolds is an 65 y.o. White or Caucasian male who presents for post op follow up after RUG. Unable to see complete passage of die through the urethra but dye is visible in the bladder. Increased pain at the incision but clean dry and intact. Perineal Urethroplasty with Cystoscopy on 3/8 by Dr Tana Felts.     Complaints:  pain  Wound/Incision:  clean & dry  Sutures/Staples:  left in  Dressing:  instruction given  Instructions Given:  exercise instructions  Restrictions:  lifting and exercise          Catheter removed without complaint.    Review of Systems   Constitutional: Negative for fever, chills, weight loss, appetite change and fatigue.   Gastrointestinal: Negative for diarrhea and constipation.   Genitourinary: Negative for difficulty urinating.   All other systems reviewed and are negative.      Allergies  Review of patient's allergies indicates no known allergies.    Medications  Outpatient Encounter Prescriptions as of 01/31/2016   Medication Sig Dispense Refill   ??? albuterol (PROVENTIL;VENTOLIN;PROAIR) 90 mcg/actuation inhaler Inhale 2 puffs into the lungs every 6 hours as needed for Wheezing.     ??? cholecalciferol, vitamin D3, 1000 units tablet Take 1,000 Units by mouth daily.     ??? docusate sodium (COLACE) 100 MG capsule Take 100 mg by mouth 2 times a day.     ??? DULoxetine (CYMBALTA) 60 MG capsule Take 60 mg by mouth daily.     ??? insulin aspart (NOVOLOG) 100 unit/mL injection Inject subcutaneously 2 times a day.        ??? insulin glargine (LANTUS) 100 unit/mL injection Inject subcutaneously at bedtime.     ??? liraglutide (VICTOZA 3-PAK) 0.6 mg/0.1 mL (18 mg/3 mL) PnIj Inject subcutaneously.     ??? lisinopril-hydrochlorothiazide (PRINZIDE,ZESTORETIC) 20-12.5 mg per tablet Take 1 tablet by mouth daily.        ??? loratadine (CLARITIN) 10 mg tablet Take 10 mg by mouth daily.     ??? metFORMIN (GLUCOPHAGE) 1000 MG tablet Take 1,000 mg by mouth 2 times a day with meals.     ??? mirtazapine (REMERON) 30 MG tablet Take 30 mg by mouth at bedtime.     ??? oxyCODONE (ROXICODONE) 5 MG immediate release tablet Take 1 tablet (5 mg total) by mouth every 6 hours as needed. 30 tablet 0   ??? polyethylene glycol (GLYCOLAX) 17 gram/dose powder Take 17 g by mouth 2 times a day as needed. 255 g 0   ??? ranitidine (ZANTAC) 150 MG tablet Take 150 mg by mouth 2 times a day.     ??? roPINIRole (REQUIP) 1 MG tablet Take 1 mg by mouth 3 times a day.     ??? rosuvastatin (CRESTOR) 40 MG tablet Take 40 mg by mouth daily.     ??? sildenafil (VIAGRA) 100 MG tablet Take 100 mg by mouth daily as needed for Erectile Dysfunction.     ??? tiotropium (SPIRIVA) 18 mcg Inhale 18 mcg into the lungs daily.     ??? traZODone (DESYREL) 100 MG tablet Take 100 mg by mouth at bedtime.     ??? zolpidem (AMBIEN) 10 mg tablet Take 10 mg by mouth at bedtime  as needed for Sleep.       No facility-administered encounter medications on file as of 01/31/2016.        Histories  He has a past medical history of Urinary retention; COPD (chronic obstructive pulmonary disease); Diabetes mellitus; Hypertension; FH: allergy; High cholesterol; Stroke; and GERD (gastroesophageal reflux disease).    He has past surgical history that includes Hernia repair; Tendon repair; Rotator cuff repair; Cystoscopy (N/A, 08/01/2015); and Urethroplasty (N/A, 01/02/2016).    His family history is not on file.    He reports that he has been smoking Cigarettes.  He has been smoking about 1.00 pack per day. He does not have any smokeless tobacco history on file. He reports that he drinks alcohol. He reports that he does not use illicit drugs.    The following portions of the patient's history were reviewed and updated as appropriate: allergies, current medications, past family history, past medical history, past social  history, past surgical history and problem list.    There were no vitals taken for this visit.  Physical Exam   Nursing note and vitals reviewed.  Constitutional: He is oriented to person, place, and time. He appears well-developed and well-nourished. No distress.   HENT:   Head: Normocephalic and atraumatic.   Eyes: Pupils are equal, round, and reactive to light.   Neck: Normal range of motion. Neck supple. No JVD present. No tracheal deviation present. No thyromegaly present.   Cardiovascular: Normal rate.    Pulmonary/Chest: Effort normal.   Abdominal: Soft.   Musculoskeletal: Normal range of motion.   Neurological: He is alert and oriented to person, place, and time.   Skin: Skin is warm and dry. He is not diaphoretic.   Psychiatric: He has a normal mood and affect. His behavior is normal. Judgment and thought content normal.            Assessment  Garrett Reynolds is an 65 y.o. White or Caucasian male who presents for  Catheter removal after Urethroplasty    Last visit note from 01/24/2016  Garrett Reynolds is an 65 y.o. White or Caucasian male who presents for post op follow up after RUG. Unable to see complete passage of die through the urethra but dye is visible in the bladder. Increased pain at the incision but clean dry and intact. Perineal Urethroplasty with Cystoscopy on 3/8 by Dr Tana Felts.   Catheter removed without issue, tolerated well.    Plan  Call or return to clinic prn if these symptoms worsen or fail to improve as anticipated.  IF unable to void call the office or go to the ED  Call with any questions        Medical Decision Making  The following items were considered in medical decision making:  Discussion of patient care with other providers  Review / order clinical lab tests  Review / order radiology tests  Review / order other diagnostic tests/interventions

## 2016-01-31 NOTE — Unmapped (Signed)
Call or return to clinic prn if these symptoms worsen or fail to improve as anticipated.

## 2016-04-04 ENCOUNTER — Ambulatory Visit: Admit: 2016-04-04 | Discharge: 2016-04-04 | Payer: PRIVATE HEALTH INSURANCE

## 2016-04-04 DIAGNOSIS — N3281 Overactive bladder: Secondary | ICD-10-CM

## 2016-04-04 MED ORDER — oxybutynin (DITROPAN-XL) 5 MG 24 hr tablet
5 | ORAL_TABLET | Freq: Every day | ORAL | 3 refills | Status: AC
Start: 2016-04-04 — End: 2016-04-04

## 2016-04-04 MED ORDER — oxybutynin (DITROPAN-XL) 5 MG 24 hr tablet
5 | ORAL_TABLET | Freq: Every day | ORAL | Status: AC
Start: 2016-04-04 — End: 2016-05-09

## 2016-04-04 MED ORDER — tamsulosin (FLOMAX) 0.4 mg Cp24
0.4 | ORAL_CAPSULE | Freq: Every evening | ORAL | Status: AC
Start: 2016-04-04 — End: 2016-05-09

## 2016-04-04 MED ORDER — tamsulosin (FLOMAX) 0.4 mg Cp24
0.4 | ORAL_CAPSULE | Freq: Every evening | ORAL | 3 refills | Status: AC
Start: 2016-04-04 — End: 2016-04-04

## 2016-04-04 NOTE — Unmapped (Signed)
Sandy calling from [employer]. She reports that pt's scripts from [facility] cannot be filled at [facility] and the urology office is not a 340B approved clinic.

## 2016-04-04 NOTE — Unmapped (Signed)
Requested Prescriptions     Pending Prescriptions Disp Refills   ??? tamsulosin (FLOMAX) 0.4 mg Cp24 30 capsule 3     Sig: Take 1 capsule (0.4 mg total) by mouth at bedtime.   ??? oxybutynin (DITROPAN-XL) 5 MG 24 hr tablet 30 tablet 3     Sig: Take 1 tablet (5 mg total) by mouth daily.     Refill E-scribed to patients listed Pharmacy

## 2016-04-04 NOTE — Unmapped (Signed)
Chief Complaint   Patient presents with   ??? Follow-up     follow up          History of Present Illness  Patient is here for follow up S/P:  PROCEDURES PERFORMED: ??  1.?? 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  2.?? Foley catheter placement.  Done 01/02/2016    Doing well  Catheter removed  LUTS improved    Has OAB symptoms      Duration: several years  Severity: moderate  Condition: chronic  Location: urethra       Review of Systems   Constitutional: Negative for fever, chills and fatigue.   HENT: Negative for congestion, ear pain and sneezing.    Eyes: Negative for pain and redness.   Respiratory: Negative for cough and choking.    Gastrointestinal: Negative for blood in stool and bloating.   Genitourinary: Negative for difficulty urinating.   Musculoskeletal: Negative for back pain.   Skin: Negative for pallor and rash.   Neurological: Negative for dizziness and headaches.       Allergies  Review of patient's allergies indicates no known allergies.    Medications  Outpatient Encounter Prescriptions as of 04/04/2016   Medication Sig Dispense Refill   ??? albuterol (PROVENTIL;VENTOLIN;PROAIR) 90 mcg/actuation inhaler Inhale 2 puffs into the lungs every 6 hours as needed for Wheezing.     ??? cholecalciferol, vitamin D3, 1000 units tablet Take 1,000 Units by mouth daily.     ??? docusate sodium (COLACE) 100 MG capsule Take 100 mg by mouth 2 times a day.     ??? DULoxetine (CYMBALTA) 60 MG capsule Take 60 mg by mouth daily.     ??? insulin aspart (NOVOLOG) 100 unit/mL injection Inject subcutaneously 2 times a day.        ??? insulin glargine (LANTUS) 100 unit/mL injection Inject subcutaneously at bedtime.     ??? liraglutide (VICTOZA 3-PAK) 0.6 mg/0.1 mL (18 mg/3 mL) PnIj Inject subcutaneously.     ??? lisinopril-hydrochlorothiazide (PRINZIDE,ZESTORETIC) 20-12.5 mg per tablet Take 1 tablet by mouth daily.     ??? loratadine (CLARITIN) 10 mg tablet Take 10 mg by mouth daily.     ??? metFORMIN  (GLUCOPHAGE) 1000 MG tablet Take 1,000 mg by mouth 2 times a day with meals.     ??? mirtazapine (REMERON) 30 MG tablet Take 30 mg by mouth at bedtime.     ??? oxyCODONE (ROXICODONE) 5 MG immediate release tablet Take 1 tablet (5 mg total) by mouth every 6 hours as needed. 30 tablet 0   ??? polyethylene glycol (GLYCOLAX) 17 gram/dose powder Take 17 g by mouth 2 times a day as needed. 255 g 0   ??? ranitidine (ZANTAC) 150 MG tablet Take 150 mg by mouth 2 times a day.     ??? roPINIRole (REQUIP) 1 MG tablet Take 1 mg by mouth 3 times a day.     ??? rosuvastatin (CRESTOR) 40 MG tablet Take 40 mg by mouth daily.     ??? sildenafil (VIAGRA) 100 MG tablet Take 100 mg by mouth daily as needed for Erectile Dysfunction.     ??? tiotropium (SPIRIVA) 18 mcg Inhale 18 mcg into the lungs daily.     ??? traZODone (DESYREL) 100 MG tablet Take 100 mg by mouth at bedtime.     ??? zolpidem (AMBIEN) 10 mg tablet Take 10 mg by mouth at bedtime as needed for Sleep.       No facility-administered  encounter medications on file as of 04/04/2016.        Histories  He has a past medical history of Urinary retention; COPD (chronic obstructive pulmonary disease); Diabetes mellitus; Hypertension; FH: allergy; High cholesterol; Stroke; and GERD (gastroesophageal reflux disease).    He has past surgical history that includes Hernia repair; Tendon repair; Rotator cuff repair; Cystoscopy (N/A, 08/01/2015); and Urethroplasty (N/A, 01/02/2016).    His family history is not on file.    He reports that he has been smoking Cigarettes.  He has been smoking about 1.00 pack per day. He does not have any smokeless tobacco history on file. He reports that he drinks alcohol. He reports that he does not use illicit drugs.      Blood pressure 143/83, pulse 92, temperature 97.1 ??F (36.2 ??C), temperature source Oral, resp. rate 16, height 6' 2 (1.88 m), weight 245 lb (111.131 kg).  Physical Exam   Constitutional: He is oriented to person, place, and time. He appears well-developed and  well-nourished. No distress.   HENT:   Head: Normocephalic and atraumatic.   Neurological: He is alert and oriented to person, place, and time.   Skin: He is not diaphoretic.   Psychiatric: He has a normal mood and affect. His behavior is normal. Judgment and thought content normal.            Assessment  Patient is here for follow up S/P:  PROCEDURES PERFORMED: ??  1.?? 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  2.?? Foley catheter placement.  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

## 2016-05-09 ENCOUNTER — Ambulatory Visit: Admit: 2016-05-09 | Discharge: 2016-05-09 | Payer: PRIVATE HEALTH INSURANCE

## 2016-05-09 DIAGNOSIS — N401 Enlarged prostate with lower urinary tract symptoms: Secondary | ICD-10-CM

## 2016-05-09 MED ORDER — tamsulosin (FLOMAX) 0.4 mg Cp24
0.4 | ORAL_CAPSULE | Freq: Every evening | ORAL | Status: AC
Start: 2016-05-09 — End: ?

## 2016-05-09 MED ORDER — oxybutynin (DITROPAN-XL) 5 MG 24 hr tablet
5 | ORAL_TABLET | Freq: Every day | ORAL | Status: AC
Start: 2016-05-09 — End: 2016-10-17

## 2016-05-09 NOTE — Unmapped (Signed)
Chief Complaint   Patient presents with   ??? Follow-up          History of Present Illness  44 yoM with hx of urethral stricture disease s/p perineal urethroplasty 12/2015. Pt with significant overactive bladder/BPH symptoms post-procedure. He was prescribed flomax and ditropan XL, however he hasn't been able to fill those 2/2 confusion with which pharmacy to use.        Review of Systems   All other systems reviewed and are negative.      Allergies  Review of patient's allergies indicates no known allergies.    Medications  Outpatient Encounter Prescriptions as of 05/09/2016   Medication Sig Dispense Refill   ??? albuterol (PROVENTIL;VENTOLIN;PROAIR) 90 mcg/actuation inhaler Inhale 2 puffs into the lungs every 6 hours as needed for Wheezing.     ??? cholecalciferol, vitamin D3, 1000 units tablet Take 1,000 Units by mouth daily.     ??? docusate sodium (COLACE) 100 MG capsule Take 100 mg by mouth 2 times a day.     ??? DULoxetine (CYMBALTA) 60 MG capsule Take 60 mg by mouth daily.     ??? insulin aspart (NOVOLOG) 100 unit/mL injection Inject subcutaneously 2 times a day.        ??? insulin glargine (LANTUS) 100 unit/mL injection Inject subcutaneously at bedtime.     ??? liraglutide (VICTOZA 3-PAK) 0.6 mg/0.1 mL (18 mg/3 mL) PnIj Inject subcutaneously.     ??? lisinopril-hydrochlorothiazide (PRINZIDE,ZESTORETIC) 20-12.5 mg per tablet Take 1 tablet by mouth daily.     ??? loratadine (CLARITIN) 10 mg tablet Take 10 mg by mouth daily.     ??? metFORMIN (GLUCOPHAGE) 1000 MG tablet Take 1,000 mg by mouth 2 times a day with meals.     ??? mirtazapine (REMERON) 30 MG tablet Take 30 mg by mouth at bedtime.     ??? oxyCODONE (ROXICODONE) 5 MG immediate release tablet Take 1 tablet (5 mg total) by mouth every 6 hours as needed. 30 tablet 0   ??? polyethylene glycol (GLYCOLAX) 17 gram/dose powder Take 17 g by mouth 2 times a day as needed. 255 g 0   ??? ranitidine (ZANTAC) 150 MG tablet Take 150 mg by mouth 2 times a day.     ??? roPINIRole (REQUIP) 1 MG  tablet Take 1 mg by mouth 3 times a day.     ??? rosuvastatin (CRESTOR) 40 MG tablet Take 40 mg by mouth daily.     ??? sildenafil (VIAGRA) 100 MG tablet Take 100 mg by mouth daily as needed for Erectile Dysfunction.     ??? tiotropium (SPIRIVA) 18 mcg Inhale 18 mcg into the lungs daily.     ??? traZODone (DESYREL) 100 MG tablet Take 100 mg by mouth at bedtime.     ??? zolpidem (AMBIEN) 10 mg tablet Take 10 mg by mouth at bedtime as needed for Sleep.     ??? [DISCONTINUED] oxybutynin (DITROPAN-XL) 5 MG 24 hr tablet Take 1 tablet (5 mg total) by mouth daily. 30 tablet 3   ??? [DISCONTINUED] tamsulosin (FLOMAX) 0.4 mg Cp24 Take 1 capsule (0.4 mg total) by mouth at bedtime. 30 capsule 3   ??? oxybutynin (DITROPAN-XL) 5 MG 24 hr tablet Take 1 tablet (5 mg total) by mouth daily. 30 tablet 11   ??? tamsulosin (FLOMAX) 0.4 mg Cp24 Take 1 capsule (0.4 mg total) by mouth at bedtime. 30 capsule 11     No facility-administered encounter medications on file as of 05/09/2016.  Histories  He has a past medical history of Urinary retention; COPD (chronic obstructive pulmonary disease); Diabetes mellitus; Hypertension; FH: allergy; High cholesterol; Stroke; and GERD (gastroesophageal reflux disease).    He has past surgical history that includes Hernia repair; Tendon repair; Rotator cuff repair; Cystoscopy (N/A, 08/01/2015); and Urethroplasty (N/A, 01/02/2016).    His family history is not on file.    He reports that he has been smoking Cigarettes.  He has been smoking about 1.00 pack per day. He does not have any smokeless tobacco history on file. He reports that he drinks alcohol. He reports that he does not use illicit drugs.    Blood pressure 167/88, height 6' 2 (1.88 m), weight 240 lb (108.863 kg).  Physical Exam    Preoperative Diagnosis: urethral stricture disease  Postoperative Diagnosis: same  Procedure Performed: Cystoscopy     Procedure in Detail:   The patient signed an informed consent. The patient was placed in the supine position.  Genitalia was prepped and draped in the usual sterile fashion. Intraurethral anesthetic was administered. Flexible cystoscopy was performed today. Urethral stricture was absent. There was a very thin area of hypopigmentation at site of prior stricture site, but no evidence of recurrent scarring/stricture. Prostate was minimally enlarged. There is mild bladder trabeculation.     Cystoscopic Findings: as above    Assessment:  75 yoM s/p perineal urethroplasty.  Urethra well healed on cysto today. Still with significant LUTS (frequency/urgency) however has not tried meds yet    Plan:  Rx for flomax/ditropan XL re-printed and handed to pt today  RTC 3 months  Repeat cysto in 1 year    Garrett Reynolds E Ronnetta Currington

## 2016-10-17 ENCOUNTER — Ambulatory Visit: Admit: 2016-10-17 | Discharge: 2016-10-17 | Payer: PRIVATE HEALTH INSURANCE

## 2016-10-17 DIAGNOSIS — Z48816 Encounter for surgical aftercare following surgery on the genitourinary system: Secondary | ICD-10-CM

## 2016-10-17 MED ORDER — oxybutynin (DITROPAN-XL) 5 MG 24 hr tablet
5 | ORAL_TABLET | Freq: Every day | ORAL | 11 refills | Status: AC
Start: 2016-10-17 — End: ?

## 2016-10-17 NOTE — Unmapped (Signed)
Chief Complaint   Patient presents with   ??? Follow-up          History of Present Illness  25 yoM with hx of urethral stricture disease s/p perineal urethroplasty 12/2015 here for routine follow up. Pt reported significant OAB symptoms after Foley removal for which he was prescribed flomax and ditropan. Today, he states that his urinary urgency and frequency are present when he takes 1 ditropan capsule, but are significantly improved when he takes 2 tablets. He reports that he has developed erectile dysfunction which he did not have pre-operatively. He is not able to get an erection firm enough for penetration. He has not tried any ED medications yet.      Review of Systems   Constitutional: Negative for chills, fatigue and fever.   HENT: Negative for congestion, ear pain and sneezing.    Eyes: Negative for pain and redness.   Respiratory: Negative for cough and choking.    Gastrointestinal: Negative for bloating and blood in stool.   Genitourinary: Negative for difficulty urinating.   Musculoskeletal: Negative for back pain.   Skin: Negative for pallor and rash.   Neurological: Negative for dizziness and headaches.       Allergies  Patient has no known allergies.    Medications  Outpatient Encounter Prescriptions as of 10/17/2016   Medication Sig Dispense Refill   ??? albuterol (PROVENTIL;VENTOLIN;PROAIR) 90 mcg/actuation inhaler Inhale 2 puffs into the lungs every 6 hours as needed for Wheezing.     ??? cholecalciferol, vitamin D3, 1000 units tablet Take 1,000 Units by mouth daily.     ??? docusate sodium (COLACE) 100 MG capsule Take 100 mg by mouth 2 times a day.     ??? DULoxetine (CYMBALTA) 60 MG capsule Take 60 mg by mouth daily.     ??? insulin aspart (NOVOLOG) 100 unit/mL injection Inject subcutaneously 2 times a day.        ??? insulin glargine (LANTUS) 100 unit/mL injection Inject subcutaneously at bedtime.     ??? liraglutide (VICTOZA 3-PAK) 0.6 mg/0.1 mL (18 mg/3 mL) PnIj Inject subcutaneously.     ???  lisinopril-hydrochlorothiazide (PRINZIDE,ZESTORETIC) 20-12.5 mg per tablet Take 1 tablet by mouth daily.     ??? loratadine (CLARITIN) 10 mg tablet Take 10 mg by mouth daily.     ??? metFORMIN (GLUCOPHAGE) 1000 MG tablet Take 1,000 mg by mouth 2 times a day with meals.     ??? mirtazapine (REMERON) 30 MG tablet Take 30 mg by mouth at bedtime.     ??? oxybutynin (DITROPAN-XL) 5 MG 24 hr tablet Take 1 tablet (5 mg total) by mouth daily. 30 tablet 11   ??? oxyCODONE (ROXICODONE) 5 MG immediate release tablet Take 1 tablet (5 mg total) by mouth every 6 hours as needed. 30 tablet 0   ??? polyethylene glycol (GLYCOLAX) 17 gram/dose powder Take 17 g by mouth 2 times a day as needed. 255 g 0   ??? ranitidine (ZANTAC) 150 MG tablet Take 150 mg by mouth 2 times a day.     ??? roPINIRole (REQUIP) 1 MG tablet Take 1 mg by mouth 3 times a day.     ??? rosuvastatin (CRESTOR) 40 MG tablet Take 40 mg by mouth daily.     ??? sildenafil (VIAGRA) 100 MG tablet Take 100 mg by mouth daily as needed for Erectile Dysfunction.     ??? tamsulosin (FLOMAX) 0.4 mg Cp24 Take 1 capsule (0.4 mg total) by mouth at bedtime. 30 capsule 11   ???  tiotropium (SPIRIVA) 18 mcg Inhale 18 mcg into the lungs daily.     ??? traZODone (DESYREL) 100 MG tablet Take 100 mg by mouth at bedtime.     ??? zolpidem (AMBIEN) 10 mg tablet Take 10 mg by mouth at bedtime as needed for Sleep.       No facility-administered encounter medications on file as of 10/17/2016.         Histories  He has a past medical history of COPD (chronic obstructive pulmonary disease) (HCC); Diabetes mellitus (HCC); FH: allergy; GERD (gastroesophageal reflux disease); High cholesterol; Hypertension; Stroke [facility]); and Urinary retention.    He has a past surgical history that includes Hernia repair; Tendon repair; Rotator cuff repair; Cystoscopy (N/A, 08/01/2015); and Urethroplasty (N/A, 01/02/2016).    His family history is not on file.    He reports that he has been smoking Cigarettes.  He has been smoking about 1.00  pack per day. He has never used smokeless tobacco. He reports that he drinks alcohol. He reports that he does not use drugs.      Blood pressure (!) 174/93, pulse 74, temperature 97.2 ??F (36.2 ??C), temperature source Oral, resp. rate 16, height 6' 2 (1.88 m), weight (!) 241 lb (109.3 kg).  Physical Exam   Nursing note and vitals reviewed.  Constitutional: He is oriented to person, place, and time. He appears well-developed and well-nourished. No distress.   HENT:   Head: Normocephalic and atraumatic.   Eyes: EOM are normal.   Neck: Normal range of motion.   Cardiovascular: Normal rate.    Pulmonary/Chest: Effort normal. No respiratory distress.   Abdominal: Soft. There is no tenderness.   Musculoskeletal: Normal range of motion.   Neurological: He is alert and oriented to person, place, and time.   Skin: Skin is warm and dry. He is not diaphoretic.   Psychiatric: He has a normal mood and affect. His behavior is normal. Judgment and thought content normal.            Assessment  65 yoM with urethral stricture s/p perineal urethroplasty 12/2015 with well healed anastamosis at 3 month cysto.  Now with BPH/OAB symptoms, which are resolved with 10 mg ditropan dosing.    Plan  Re-check cysto at 12 months (12/2016)  Increase ditropan XL to 10 mg daily  [facility]    Laroy Apple    I performed a history and physical exam of the patient and discussed patient management with the resident I reviewed the resident's note and agree with the documented findings and plan of care.  Gwenlyn Perking Ram Haugan

## 2017-04-21 ENCOUNTER — Inpatient Hospital Stay: Admit: 2017-04-21 | Discharge: 2017-04-22 | Disposition: A | Discharge status: Home / Self Care

## 2017-04-21 ENCOUNTER — Encounter: Admit: 2017-04-21

## 2017-04-21 DIAGNOSIS — S60811A Abrasion of right wrist, initial encounter: Secondary | ICD-10-CM

## 2017-04-21 MED ORDER — ACETAMINOPHEN 325 MG PO TABS
325 MG | Freq: Once | ORAL | Status: AC
Start: 2017-04-21 — End: 2017-04-21
  Administered 2017-04-21: 23:00:00 650 mg via ORAL

## 2017-04-21 MED ORDER — CYCLOBENZAPRINE HCL 5 MG PO TABS
5 MG | ORAL_TABLET | Freq: Three times a day (TID) | ORAL | 0 refills | Status: AC | PRN
Start: 2017-04-21 — End: ?

## 2017-04-21 MED FILL — TYLENOL 325 MG PO TABS: 325 MG | ORAL | Qty: 2

## 2017-04-21 NOTE — ED Notes (Signed)
Portable xray at bedside at this time. Will continue to monitor.      Bascom LevelsMeghann C. Blaznek, RN  04/21/17 [phone]

## 2017-04-21 NOTE — ED Notes (Signed)
Cleaned wounds on right leg and right wrist.      Rafael BihariFelicia Glen Wagner  04/21/17 1922
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
## 2017-04-21 NOTE — ED Notes (Signed)
Report received from Melissa, RN.      Kearia Yin C. Blaznek, RN  04/21/17 1904

## 2017-04-21 NOTE — ED Notes (Signed)
Ace bandage applied to right wrist. Pt tolerated well.      Barbette ReichmannSarah Matayah Reyburn, RN  04/21/17 [phone]
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
## 2017-04-21 NOTE — Discharge Instructions (Signed)
X-ray of your reparation no evidence of fracture dislocation.  Ace wrap applied.  I do believe that you have increasing soreness over the next several days.  I do recommend continue naproxen as anti-inflammatory and as prescribed by your physician.  I do recommend Tylenol for additional pain control.  I did prescribe a muscle relaxer and to be used if needed.  Ice/heat may benefit.  Contact and follow with your physician within the next 2 days.  Return to this facility if your symptoms worsen.

## 2018-01-25 DEATH — deceased

## 2021-07-12 ENCOUNTER — Emergency Department (HOSPITAL_COMMUNITY): Payer: Medicare PPO

## 2021-07-12 ENCOUNTER — Other Ambulatory Visit: Payer: Self-pay

## 2021-07-12 ENCOUNTER — Encounter (HOSPITAL_COMMUNITY): Payer: Self-pay | Admitting: *Deleted

## 2021-07-12 ENCOUNTER — Emergency Department (HOSPITAL_COMMUNITY)
Admission: EM | Admit: 2021-07-12 | Discharge: 2021-07-12 | Disposition: A | Discharge status: Home / Self Care | Payer: Medicare PPO | Attending: Emergency Medicine | Admitting: Emergency Medicine

## 2021-07-12 DIAGNOSIS — R42 Dizziness and giddiness: Secondary | ICD-10-CM | POA: Diagnosis present

## 2021-07-12 HISTORY — DX: Anemia, unspecified: D64.9

## 2021-07-12 LAB — CBC WITH DIFFERENTIAL/PLATELET
Abs Immature Granulocytes: 0.01 10*3/uL (ref 0.00–0.07)
Basophils Absolute: 0 10*3/uL (ref 0.0–0.1)
Basophils Relative: 1 %
Eosinophils Absolute: 0.1 10*3/uL (ref 0.0–0.5)
Eosinophils Relative: 1 %
HCT: 39.9 % (ref 39.0–52.0)
Hemoglobin: 12.6 g/dL — ABNORMAL LOW (ref 13.0–17.0)
Immature Granulocytes: 0 %
Lymphocytes Relative: 26 %
Lymphs Abs: 1.5 10*3/uL (ref 0.7–4.0)
MCH: 25.7 pg — ABNORMAL LOW (ref 26.0–34.0)
MCHC: 31.6 g/dL (ref 30.0–36.0)
MCV: 81.4 fL (ref 80.0–100.0)
Monocytes Absolute: 0.3 10*3/uL (ref 0.1–1.0)
Monocytes Relative: 6 %
Neutro Abs: 3.8 10*3/uL (ref 1.7–7.7)
Neutrophils Relative %: 66 %
Platelets: 248 10*3/uL (ref 150–400)
RBC: 4.9 MIL/uL (ref 4.22–5.81)
RDW: 14.5 % (ref 11.5–15.5)
WBC: 5.7 10*3/uL (ref 4.0–10.5)
nRBC: 0 % (ref 0.0–0.2)

## 2021-07-12 LAB — COMPREHENSIVE METABOLIC PANEL
ALT: 17 U/L (ref 0–44)
AST: 24 U/L (ref 15–41)
Albumin: 4.3 g/dL (ref 3.5–5.0)
Alkaline Phosphatase: 73 U/L (ref 38–126)
Anion gap: 9 (ref 5–15)
BUN: 20 mg/dL (ref 8–23)
CO2: 24 mmol/L (ref 22–32)
Calcium: 9.3 mg/dL (ref 8.9–10.3)
Chloride: 105 mmol/L (ref 98–111)
Creatinine, Ser: 1.16 mg/dL (ref 0.61–1.24)
GFR, Estimated: >60 mL/min (ref >60)
Glucose, Bld: 103 mg/dL — ABNORMAL HIGH (ref 70–99)
Potassium: 4.2 mmol/L (ref 3.5–5.1)
Sodium: 138 mmol/L (ref 135–145)
Total Bilirubin: 0.6 mg/dL (ref 0.3–1.2)
Total Protein: 7.8 g/dL (ref 6.5–8.1)

## 2021-07-12 LAB — TROPONIN I (HIGH SENSITIVITY)
Troponin I (High Sensitivity): 2 ng/L (ref <18)
Troponin I (High Sensitivity): 3 ng/L (ref <18)

## 2021-07-12 MED ORDER — MECLIZINE HCL 25 MG PO TABS: 25.0000 mg | ORAL_TABLET | Freq: Three times a day (TID) | ORAL | 0 refills | Status: DC | PRN

## 2021-07-12 MED ORDER — SODIUM CHLORIDE 0.9 % IV SOLN: 2.0000 g | Freq: Once | INTRAVENOUS | Status: DC

## 2021-07-12 MED ORDER — SODIUM CHLORIDE 0.9 % IV BOLUS
500.0000 mL | Freq: Once | INTRAVENOUS | Status: AC
  Administered 2021-07-12: 500 mL via INTRAVENOUS

## 2021-07-12 NOTE — ED Triage Notes (Signed)
Pt states he was mowing on Monday this week on a riding lawnmower and had a near syncope episode while on the mower; pt states the feeling went away after a few moments but since then he has had some feelings of dizziness

## 2021-07-12 NOTE — ED Provider Notes (Signed)
[facility] EMERGENCY DEPARTMENT Provider Note   CSN: [id number] Arrival date & time: 07/12/21  4627     History Chief Complaint  Patient presents with   Dizziness    Oscar Morse is a 70 y.o. male.  Patient states that he has had some dizziness over the last few days when he moves around  The history is provided by the patient and medical records. No language interpreter was used.  Dizziness Quality:  Head spinning Severity:  Mild Onset quality:  Sudden Timing:  Constant Progression:  Worsening Chronicity:  New Context: bending over   Relieved by:  Nothing Worsened by:  Nothing Ineffective treatments:  None tried Associated symptoms: no blood in stool, no chest pain, no diarrhea and no headaches       Past Medical History:  Diagnosis Date   Anemia     There are no problems to display for this patient.   History reviewed. No pertinent surgical history.     History reviewed. No pertinent family history.  Social History   Tobacco Use   Smoking status: Never   Smokeless tobacco: Never  Vaping Use   Vaping Use: Never used  Substance Use Topics   Alcohol use: Never   Drug use: Never    Home Medications Prior to Admission medications   Medication Sig Start Date End Date Taking? Authorizing Provider  ferrous sulfate 325 (65 FE) MG tablet Take 325 mg by mouth daily with breakfast.   Yes [provider]  meclizine (ANTIVERT) 25 MG tablet Take 1 tablet (25 mg total) by mouth 3 (three) times daily as needed for dizziness. 07/12/21  Yes Bethann Berkshire, MD  Multiple Vitamin (MULTIVITAMIN WITH MINERALS) TABS tablet Take by mouth daily.   Yes [provider]    Allergies    Patient has no known allergies.  Review of Systems   Review of Systems  Constitutional:  Negative for appetite change and fatigue.  HENT:  Negative for congestion, ear discharge and sinus pressure.   Eyes:  Negative for discharge.  Respiratory:  Negative for cough.    Cardiovascular:  Negative for chest pain.  Gastrointestinal:  Negative for abdominal pain, blood in stool and diarrhea.  Genitourinary:  Negative for frequency and hematuria.  Musculoskeletal:  Negative for back pain.  Skin:  Negative for rash.  Neurological:  Positive for dizziness. Negative for seizures and headaches.  Psychiatric/Behavioral:  Negative for hallucinations.    Physical Exam Updated Vital Signs BP 132/82   Pulse 62   Temp 98.1 F (36.7 C) (Oral)   Resp 14   Ht 5\' 7"  (1.702 m)   Wt 59 kg   SpO2 100%   BMI 20.36 kg/m   Physical Exam Vitals and nursing note reviewed.  Constitutional:      Appearance: He is well-developed.  HENT:     Head: Normocephalic.     Nose: Nose normal.  Eyes:     General: No scleral icterus.    Conjunctiva/sclera: Conjunctivae normal.     Comments: No nystagmus  Neck:     Thyroid: No thyromegaly.  Cardiovascular:     Rate and Rhythm: Normal rate and regular rhythm.     Heart sounds: No murmur heard.   No friction rub. No gallop.  Pulmonary:     Breath sounds: No stridor. No wheezing or rales.  Chest:     Chest wall: No tenderness.  Abdominal:     General: There is no distension.     Tenderness:  There is no abdominal tenderness. There is no rebound.  Musculoskeletal:        General: Normal range of motion.     Cervical back: Neck supple.  Lymphadenopathy:     Cervical: No cervical adenopathy.  Skin:    Findings: No erythema or rash.  Neurological:     Mental Status: He is alert and oriented to person, place, and time.     Motor: No abnormal muscle tone.     Coordination: Coordination normal.  Psychiatric:        Behavior: Behavior normal.    ED Results / Procedures / Treatments   Labs (all labs ordered are listed, but only abnormal results are displayed) Labs Reviewed  CBC WITH DIFFERENTIAL/PLATELET - Abnormal; Notable for the following components:      Result Value   Hemoglobin 12.6 (*)    MCH 25.7 (*)    All  other components within normal limits  COMPREHENSIVE METABOLIC PANEL - Abnormal; Notable for the following components:   Glucose, Bld 103 (*)    All other components within normal limits  TROPONIN I (HIGH SENSITIVITY)  TROPONIN I (HIGH SENSITIVITY)    EKG None  Radiology MR BRAIN WO CONTRAST  Result Date: 07/12/2021 CLINICAL DATA:  Dizziness, near syncope EXAM: MRI HEAD WITHOUT CONTRAST TECHNIQUE: Multiplanar, multiecho pulse sequences of the brain and surrounding structures were obtained without intravenous contrast. COMPARISON:  CT head 11/16/2014 (report only, images not available FINDINGS: Brain: There is no evidence of acute intracranial hemorrhage, extra-axial fluid collection, or infarct. The ventricles are normal in size. There are scattered small foci of FLAIR signal abnormality in the subcortical and periventricular white matter likely reflecting sequela of mild chronic white matter microangiopathy. There is no suspicious parenchymal signal abnormality. There is no mass lesion. There is no midline shift. Vascular: Normal flow voids. Skull and upper cervical spine: Normal marrow signal. Sinuses/Orbits: There is mild mucosal thickening in the right maxillary sinus. The globes and orbits are unremarkable. Other: The mastoid air cells are clear. IMPRESSION: 1. No acute intracranial pathology. 2. Mild chronic white matter microangiopathy. Electronically Signed   By: Lesia Hausen M.D.   On: 07/12/2021 10:56   DG Chest Port 1 View  Result Date: 07/12/2021 CLINICAL DATA:  Weakness EXAM: PORTABLE CHEST 1 VIEW COMPARISON:  None. FINDINGS: The heart size and mediastinal contours are within normal limits. Both lungs are clear. The visualized skeletal structures are unremarkable. IMPRESSION: No active disease. Electronically Signed   By: Charlett Nose M.D.   On: 07/12/2021 10:20    Procedures Procedures   Medications Ordered in ED Medications  sodium chloride 0.9 % bolus 500 mL (0 mLs  Intravenous Stopped 07/12/21 1137)    ED Course  I have reviewed the triage vital signs and the nursing notes.  Pertinent labs & imaging results that were available during my care of the patient were reviewed by me and considered in my medical decision making (see chart for details).    MDM Rules/Calculators/A&P                           MRI does not show any stroke.  Suspect dizziness is vertigo.  He is given Antivert and will follow up with his family doctor Final Clinical Impression(s) / ED Diagnoses Final diagnoses:  Dizziness    Rx / DC Orders ED Discharge Orders          Ordered    meclizine (ANTIVERT)  25 MG tablet  3 times daily PRN        07/12/21 1512             Bethann Berkshire, MD 07/15/21 1741

## 2021-07-12 NOTE — Discharge Instructions (Addendum)
Follow-up with your family doctor next week for recheck. 

## 2021-07-12 NOTE — ED Notes (Signed)
Pt transported to MRI 

## 2021-08-24 ENCOUNTER — Other Ambulatory Visit: Payer: Self-pay

## 2021-08-24 ENCOUNTER — Encounter (HOSPITAL_COMMUNITY): Payer: Self-pay

## 2021-08-24 ENCOUNTER — Emergency Department (HOSPITAL_COMMUNITY): Payer: Medicare PPO

## 2021-08-24 ENCOUNTER — Emergency Department (HOSPITAL_COMMUNITY)
Admission: EM | Admit: 2021-08-24 | Discharge: 2021-08-24 | Disposition: A | Discharge status: Home / Self Care | Payer: Medicare PPO | Attending: Emergency Medicine | Admitting: Emergency Medicine

## 2021-08-24 DIAGNOSIS — N200 Calculus of kidney: Secondary | ICD-10-CM

## 2021-08-24 DIAGNOSIS — N132 Hydronephrosis with renal and ureteral calculous obstruction: Secondary | ICD-10-CM | POA: Diagnosis not present

## 2021-08-24 DIAGNOSIS — R1032 Left lower quadrant pain: Secondary | ICD-10-CM | POA: Diagnosis present

## 2021-08-24 LAB — CBC WITH DIFFERENTIAL/PLATELET
Abs Immature Granulocytes: 0.03 10*3/uL (ref 0.00–0.07)
Basophils Absolute: 0.1 10*3/uL (ref 0.0–0.1)
Basophils Relative: 1 %
Eosinophils Absolute: 0 10*3/uL (ref 0.0–0.5)
Eosinophils Relative: 1 %
HCT: 33.9 % — ABNORMAL LOW (ref 39.0–52.0)
Hemoglobin: 10.5 g/dL — ABNORMAL LOW (ref 13.0–17.0)
Immature Granulocytes: 0 %
Lymphocytes Relative: 15 %
Lymphs Abs: 1.2 10*3/uL (ref 0.7–4.0)
MCH: 24.4 pg — ABNORMAL LOW (ref 26.0–34.0)
MCHC: 31 g/dL (ref 30.0–36.0)
MCV: 78.7 fL — ABNORMAL LOW (ref 80.0–100.0)
Monocytes Absolute: 0.6 10*3/uL (ref 0.1–1.0)
Monocytes Relative: 8 %
Neutro Abs: 5.8 10*3/uL (ref 1.7–7.7)
Neutrophils Relative %: 75 %
Platelets: 277 10*3/uL (ref 150–400)
RBC: 4.31 MIL/uL (ref 4.22–5.81)
RDW: 14.2 % (ref 11.5–15.5)
WBC: 7.6 10*3/uL (ref 4.0–10.5)
nRBC: 0 % (ref 0.0–0.2)

## 2021-08-24 LAB — I-STAT CHEM 8, ED
BUN: 18 mg/dL (ref 8–23)
Calcium, Ion: 1.17 mmol/L (ref 1.15–1.40)
Chloride: 107 mmol/L (ref 98–111)
Creatinine, Ser: 1.2 mg/dL (ref 0.61–1.24)
Glucose, Bld: 124 mg/dL — ABNORMAL HIGH (ref 70–99)
HCT: 34 % — ABNORMAL LOW (ref 39.0–52.0)
Hemoglobin: 11.6 g/dL — ABNORMAL LOW (ref 13.0–17.0)
Potassium: 3.8 mmol/L (ref 3.5–5.1)
Sodium: 141 mmol/L (ref 135–145)
TCO2: 23 mmol/L (ref 22–32)

## 2021-08-24 LAB — URINALYSIS, ROUTINE W REFLEX MICROSCOPIC
Bacteria, UA: NONE SEEN
Bilirubin Urine: NEGATIVE
Glucose, UA: NEGATIVE mg/dL
Hgb urine dipstick: NEGATIVE
Ketones, ur: 20 mg/dL — AB
Nitrite: NEGATIVE
Protein, ur: NEGATIVE mg/dL
Specific Gravity, Urine: 1.011 (ref 1.005–1.030)
pH: 5 (ref 5.0–8.0)

## 2021-08-24 LAB — LIPASE, BLOOD: Lipase: 27 U/L (ref 11–51)

## 2021-08-24 MED ORDER — TAMSULOSIN HCL 0.4 MG PO CAPS: 0.4000 mg | ORAL_CAPSULE | Freq: Every day | ORAL | 0 refills | Status: DC

## 2021-08-24 MED ORDER — IOHEXOL 300 MG/ML  SOLN
100.0000 mL | Freq: Once | INTRAMUSCULAR | Status: AC | PRN
  Administered 2021-08-24: 100 mL via INTRAVENOUS

## 2021-08-24 MED ORDER — HYDROCODONE-ACETAMINOPHEN 5-325 MG PO TABS: 1.0000 | ORAL_TABLET | Freq: Four times a day (QID) | ORAL | 0 refills | Status: DC | PRN

## 2021-08-24 NOTE — ED Notes (Signed)
Niece Parke Simmers [phone] would like to talk to the patient

## 2021-08-24 NOTE — ED Provider Notes (Signed)
Emergency Medicine Provider Triage Evaluation Note  Oscar Morse , a 70 y.o. male  was evaluated in triage.  Pt complains of LLQ pain. Was seen two weeks ago and dx with UTI, seen again and given antibiotics for prospective uncomplicated diverticulitis, completed course and now complaining of return of LLQ pain. Some associated nausea and vomiting although not at this moment. 5/10 pain at this time.  Review of Systems  Positive: LLQ pain Negative: Dysuria, frequency, N/V  Physical Exam  BP (!) 147/80   Pulse 77   Temp 98.4 F (36.9 C)   Resp 16   SpO2 100%  Gen:   Awake, no distress   Resp:  Normal effort  MSK:   Moves extremities without difficulty  Other:  Some TTP llq, patient does not have a very strong reaction. No rebound/rigidity/guarding  Medical Decision Making  Medically screening exam initiated at 12:47 PM.  Appropriate orders placed.  Skylan Lara was informed that the remainder of the evaluation will be completed by another provider, this initial triage assessment does not replace that evaluation, and the importance of remaining in the ED until their evaluation is complete.  Sent for re-evaluation and r/o diverticulitis by PCP   Olene Floss, PA-C 08/24/21 1249    Tegeler, Canary Brim, MD 08/24/21 2027

## 2021-08-24 NOTE — Discharge Instructions (Addendum)
You have been diagnosed with a 7 mm kidney stone on the left side causing discomfort.  Please use urine strainer when urinating capture the stone and bring it to the urologist for further assessment.  Take pain medication as needed for pain and take Flomax to help you pass the stone.  Return if you have any concern.

## 2021-08-24 NOTE — ED Provider Notes (Signed)
[facility] EMERGENCY DEPARTMENT Provider Note   CSN: [id number] Arrival date & time: 08/24/21  1137     History No chief complaint on file.   Maurice Fotheringham is a 70 y.o. male.  The history is provided by the patient and medical records. No language interpreter was used.   70 year old male presenting evaluation of abdominal pain.  Patient report for the past 2 days he has been experiencing pain to his left lower quadrant.  Described pain as any crampy sensation waxing waning.  Pain is minimal at this time.  He did endorse some mild nausea without any vomiting.  No fever chills dysuria or blood in his stool.  Was diagnosed with UTI and was treated with antibiotic approximate 2 weeks ago.  And patient initially was evaluated by PCP and sent here for diverticulitis rule out.  Past Medical History:  Diagnosis Date   Anemia     There are no problems to display for this patient.   History reviewed. No pertinent surgical history.     No family history on file.  Social History   Tobacco Use   Smoking status: Never   Smokeless tobacco: Never  Vaping Use   Vaping Use: Never used  Substance Use Topics   Alcohol use: Never   Drug use: Never    Home Medications Prior to Admission medications   Medication Sig Start Date End Date Taking? Authorizing Provider  ferrous sulfate 325 (65 FE) MG tablet Take 325 mg by mouth daily with breakfast.    [provider]  meclizine (ANTIVERT) 25 MG tablet Take 1 tablet (25 mg total) by mouth 3 (three) times daily as needed for dizziness. 07/12/21   Bethann Berkshire, MD  Multiple Vitamin (MULTIVITAMIN WITH MINERALS) TABS tablet Take by mouth daily.    [provider]    Allergies    Patient has no known allergies.  Review of Systems   Review of Systems  All other systems reviewed and are negative.  Physical Exam Updated Vital Signs BP (!) 147/80   Pulse 77   Temp 98.4 F (36.9 C)   Resp 16   SpO2  100%   Physical Exam Vitals and nursing note reviewed.  Constitutional:      General: He is not in acute distress.    Appearance: He is well-developed.  HENT:     Head: Atraumatic.  Eyes:     Conjunctiva/sclera: Conjunctivae normal.  Cardiovascular:     Rate and Rhythm: Normal rate and regular rhythm.     Pulses: Normal pulses.     Heart sounds: Normal heart sounds.  Pulmonary:     Effort: Pulmonary effort is normal.     Breath sounds: Normal breath sounds.  Abdominal:     Palpations: Abdomen is soft.     Tenderness: There is abdominal tenderness (Tenderness to the left lower quadrant without guarding or rebound tenderness.).  Musculoskeletal:     Cervical back: Neck supple.  Skin:    Findings: No rash.  Neurological:     Mental Status: He is alert.    ED Results / Procedures / Treatments   Labs (all labs ordered are listed, but only abnormal results are displayed) Labs Reviewed  CBC WITH DIFFERENTIAL/PLATELET - Abnormal; Notable for the following components:      Result Value   Hemoglobin 10.5 (*)    HCT 33.9 (*)    MCV 78.7 (*)    MCH 24.4 (*)    All other components  within normal limits  URINALYSIS, ROUTINE W REFLEX MICROSCOPIC - Abnormal; Notable for the following components:   Ketones, ur 20 (*)    Leukocytes,Ua TRACE (*)    All other components within normal limits  I-STAT CHEM 8, ED - Abnormal; Notable for the following components:   Glucose, Bld 124 (*)    Hemoglobin 11.6 (*)    HCT 34.0 (*)    All other components within normal limits  LIPASE, BLOOD    EKG None  Radiology CT ABDOMEN PELVIS W CONTRAST  Result Date: 08/24/2021 CLINICAL DATA:  Diverticulitis EXAM: CT ABDOMEN AND PELVIS WITH CONTRAST TECHNIQUE: Multidetector CT imaging of the abdomen and pelvis was performed using the standard protocol following bolus administration of intravenous contrast. CONTRAST:  OMNIPAQUE IOHEXOL 300 MG/ML  SOLN COMPARISON:  None. FINDINGS: Lower chest: No  acute abnormality. Hepatobiliary: No focal liver abnormality is seen. No gallstones, gallbladder wall thickening, or biliary dilatation. Pancreas: Unremarkable Spleen: Unremarkable Adrenals/Urinary Tract: The adrenal glands are unremarkable. The kidneys are normal in size and position. There is mild left hydronephrosis and hydroureter to the level of the mid left ureter at the crossing iliac vasculature within obstructing 5 x 7 x 7 mm calculus is present no other intrarenal or ureteral calculi. Mildly delayed left renal cortical enhancement secondary to obstructive uropathy. No enhancing cortical mass. No hydronephrosis on the right. The bladder is unremarkable. Stomach/Bowel: Stomach is within normal limits. Appendix appears normal. No evidence of bowel wall thickening, distention, or inflammatory changes. No free intraperitoneal gas. Vascular/Lymphatic: Circumaortic left renal vein. Aortic atherosclerosis. No enlarged abdominal or pelvic lymph nodes. Reproductive: Marked prostatic enlargement. Other: Tiny fat containing umbilical hernia. Tiny fat containing left inguinal hernia. Rectum unremarkable. Musculoskeletal: No acute bone abnormality. No lytic or blastic bone lesion IMPRESSION: Obstructing 5 x 7 x 7 mm calculus within the mid left ureter resulting in mild left hydronephrosis. Marked prostatic enlargement without evidence of bladder outlet obstruction. Aortic Atherosclerosis (ICD10-I70.0). Electronically Signed   By: Helyn Numbers M.D.   On: 08/24/2021 19:18    Procedures Procedures   Medications Ordered in ED Medications  iohexol (OMNIPAQUE) 300 MG/ML solution 100 mL (100 mLs Intravenous Contrast Given 08/24/21 1900)    ED Course  I have reviewed the triage vital signs and the nursing notes.  Pertinent labs & imaging results that were available during my care of the patient were reviewed by me and considered in my medical decision making (see chart for details).    MDM  Rules/Calculators/A&P                           BP 136/80 (BP Location: Left Arm)   Pulse 66   Temp 98.4 F (36.9 C)   Resp 16   SpO2 100%   Final Clinical Impression(s) / ED Diagnoses Final diagnoses:  Kidney stone on left side    Rx / DC Orders ED Discharge Orders          Ordered    tamsulosin (FLOMAX) 0.4 MG CAPS capsule  Daily        08/24/21 2035    HYDROcodone-acetaminophen (NORCO/VICODIN) 5-325 MG tablet  Every 6 hours PRN        08/24/21 2035           6:13 PM Patient here with left lower quadrant abdominal pain.  Minimal tenderness to the left lower quadrant on my exam.  Overall well-appearing.  Vital signs stable, labs are  reassuring.  Currently awaits results of CT scan.  7:31 PM Lab so reassuring, UA without signs of urinary tract infection, CT scan of the abdomen pelvis demonstrated an obstructive 7 mm kidney stone at the mid left ureter with mild left hydronephrosis.  This finding is consistent with patient's presenting complaint.  At this time he is resting comfortably.  Will discharge home with pain medication, Flomax, urine strainer, and outpatient follow-up with urology recommended.  Care discussed with Dr. Freida Busman.  Patient is stable for discharge.   Fayrene Helper, PA-C 08/24/21 2036    Lorre Nick, MD 08/27/21 [phone]

## 2021-08-24 NOTE — ED Triage Notes (Signed)
Patient has had LLQ abdominal pain intermittently x 6 weeks. Took antibiotics with some relief and pain returned yesterday. Denies dysuria. No nausea, no vomiting, no diarrhea. Denies fever

## 2021-09-05 ENCOUNTER — Encounter (HOSPITAL_COMMUNITY): Payer: Self-pay | Admitting: *Deleted

## 2021-09-05 ENCOUNTER — Emergency Department (HOSPITAL_COMMUNITY): Payer: Medicare PPO

## 2021-09-05 ENCOUNTER — Emergency Department (HOSPITAL_COMMUNITY)
Admission: EM | Admit: 2021-09-05 | Discharge: 2021-09-05 | Disposition: A | Discharge status: Home / Self Care | Payer: Medicare PPO | Attending: Student | Admitting: Student

## 2021-09-05 ENCOUNTER — Other Ambulatory Visit: Payer: Self-pay

## 2021-09-05 DIAGNOSIS — N132 Hydronephrosis with renal and ureteral calculous obstruction: Secondary | ICD-10-CM | POA: Diagnosis not present

## 2021-09-05 DIAGNOSIS — R109 Unspecified abdominal pain: Secondary | ICD-10-CM | POA: Diagnosis present

## 2021-09-05 DIAGNOSIS — N200 Calculus of kidney: Secondary | ICD-10-CM

## 2021-09-05 LAB — CBC WITH DIFFERENTIAL/PLATELET
Abs Immature Granulocytes: 0.04 10*3/uL (ref 0.00–0.07)
Basophils Absolute: 0 10*3/uL (ref 0.0–0.1)
Basophils Relative: 0 %
Eosinophils Absolute: 0.1 10*3/uL (ref 0.0–0.5)
Eosinophils Relative: 1 %
HCT: 36 % — ABNORMAL LOW (ref 39.0–52.0)
Hemoglobin: 11.3 g/dL — ABNORMAL LOW (ref 13.0–17.0)
Immature Granulocytes: 0 %
Lymphocytes Relative: 16 %
Lymphs Abs: 1.6 10*3/uL (ref 0.7–4.0)
MCH: 24.8 pg — ABNORMAL LOW (ref 26.0–34.0)
MCHC: 31.4 g/dL (ref 30.0–36.0)
MCV: 79.1 fL — ABNORMAL LOW (ref 80.0–100.0)
Monocytes Absolute: 0.7 10*3/uL (ref 0.1–1.0)
Monocytes Relative: 8 %
Neutro Abs: 7.1 10*3/uL (ref 1.7–7.7)
Neutrophils Relative %: 75 %
Platelets: 262 10*3/uL (ref 150–400)
RBC: 4.55 MIL/uL (ref 4.22–5.81)
RDW: 14.3 % (ref 11.5–15.5)
WBC: 9.5 10*3/uL (ref 4.0–10.5)
nRBC: 0 % (ref 0.0–0.2)

## 2021-09-05 LAB — BASIC METABOLIC PANEL
Anion gap: 8 (ref 5–15)
BUN: 26 mg/dL — ABNORMAL HIGH (ref 8–23)
CO2: 23 mmol/L (ref 22–32)
Calcium: 9.2 mg/dL (ref 8.9–10.3)
Chloride: 104 mmol/L (ref 98–111)
Creatinine, Ser: 1.4 mg/dL — ABNORMAL HIGH (ref 0.61–1.24)
GFR, Estimated: 54 mL/min — ABNORMAL LOW (ref >60)
Glucose, Bld: 137 mg/dL — ABNORMAL HIGH (ref 70–99)
Potassium: 4 mmol/L (ref 3.5–5.1)
Sodium: 135 mmol/L (ref 135–145)

## 2021-09-05 LAB — URINALYSIS, ROUTINE W REFLEX MICROSCOPIC
Bacteria, UA: NONE SEEN
Bilirubin Urine: NEGATIVE
Glucose, UA: NEGATIVE mg/dL
Ketones, ur: NEGATIVE mg/dL
Nitrite: NEGATIVE
Protein, ur: NEGATIVE mg/dL
Specific Gravity, Urine: 1.013 (ref 1.005–1.030)
pH: 5 (ref 5.0–8.0)

## 2021-09-05 MED ORDER — ONDANSETRON HCL 4 MG/2ML IJ SOLN
4.0000 mg | Freq: Once | INTRAMUSCULAR | Status: AC
  Administered 2021-09-05: 4 mg via INTRAVENOUS
  Filled 2021-09-05: qty 2

## 2021-09-05 MED ORDER — NAPROXEN 500 MG PO TABS: 500.0000 mg | ORAL_TABLET | Freq: Two times a day (BID) | ORAL | 0 refills | Status: DC

## 2021-09-05 MED ORDER — MORPHINE SULFATE (PF) 4 MG/ML IV SOLN
3.0000 mg | Freq: Once | INTRAVENOUS | Status: AC
  Administered 2021-09-05: 3 mg via INTRAVENOUS
  Filled 2021-09-05: qty 1

## 2021-09-05 MED ORDER — KETOROLAC TROMETHAMINE 15 MG/ML IJ SOLN
15.0000 mg | Freq: Once | INTRAMUSCULAR | Status: AC
  Administered 2021-09-05: 15 mg via INTRAVENOUS
  Filled 2021-09-05: qty 1

## 2021-09-05 MED ORDER — IOHEXOL 300 MG/ML  SOLN
70.0000 mL | Freq: Once | INTRAMUSCULAR | Status: AC | PRN
  Administered 2021-09-05: 70 mL via INTRAVENOUS

## 2021-09-05 MED ORDER — LACTATED RINGERS IV BOLUS
1000.0000 mL | Freq: Once | INTRAVENOUS | Status: AC
  Administered 2021-09-05: 1000 mL via INTRAVENOUS

## 2021-09-05 MED ORDER — MORPHINE SULFATE (PF) 2 MG/ML IV SOLN
2.0000 mg | Freq: Once | INTRAVENOUS | Status: AC
  Administered 2021-09-05: 2 mg via INTRAVENOUS
  Filled 2021-09-05: qty 1

## 2021-09-05 MED ORDER — OXYCODONE HCL 5 MG PO TABS: 5.0000 mg | ORAL_TABLET | ORAL | 0 refills | Status: DC | PRN

## 2021-09-05 NOTE — ED Provider Notes (Signed)
[facility] EMERGENCY DEPARTMENT Provider Note   CSN: [id number] Arrival date & time: 09/05/21  [id number]     History Chief Complaint  Patient presents with   Abdominal Pain    Oscar Morse is a 70 y.o. male with PMH anemia, previous left-sided nephrolithiasis diagnosed on 08/24/2021 who presents the emergency department for evaluation of left lower quadrant pain.  Patient states that he has been compliant with his Flomax therapy but his left-sided abdominal pain and flank pain returned over the last 48 hours.  He was told he needs to follow-up with a urologist in [place], but he has not been able to find these urologist yet.  He denies chest pain, shortness of breath, abdominal pain, nausea, vomiting or other systemic symptoms.  Nuys fevers or dysuria.   Abdominal Pain Associated symptoms: no chest pain, no chills, no cough, no dysuria, no fever, no hematuria, no shortness of breath, no sore throat and no vomiting       Past Medical History:  Diagnosis Date   Anemia     There are no problems to display for this patient.   History reviewed. No pertinent surgical history.     No family history on file.  Social History   Tobacco Use   Smoking status: Never   Smokeless tobacco: Never  Vaping Use   Vaping Use: Never used  Substance Use Topics   Alcohol use: Never   Drug use: Never    Home Medications Prior to Admission medications   Medication Sig Start Date End Date Taking? Authorizing Provider  ferrous sulfate 325 (65 FE) MG tablet Take 325 mg by mouth daily with breakfast.   Yes [provider]  Multiple Vitamin (MULTIVITAMIN WITH MINERALS) TABS tablet Take 1 tablet by mouth daily.   Yes [provider]  naproxen (NAPROSYN) 500 MG tablet Take 1 tablet (500 mg total) by mouth 2 (two) times daily. 09/05/21  Yes Ellaree Gear, MD  oxyCODONE (ROXICODONE) 5 MG immediate release tablet Take 1 tablet (5 mg total) by mouth every 4  (four) hours as needed for severe pain. 09/05/21  Yes Gertude Benito, MD  meclizine (ANTIVERT) 25 MG tablet Take 1 tablet (25 mg total) by mouth 3 (three) times daily as needed for dizziness. Patient not taking: Reported on 09/05/2021 07/12/21   Milton Ferguson, MD  tamsulosin (FLOMAX) 0.4 MG CAPS capsule Take 1 capsule (0.4 mg total) by mouth daily. Patient not taking: Reported on 09/05/2021 08/24/21   Domenic Moras, PA-C    Allergies    Patient has no known allergies.  Review of Systems   Review of Systems  Constitutional:  Negative for chills and fever.  HENT:  Negative for ear pain and sore throat.   Eyes:  Negative for pain and visual disturbance.  Respiratory:  Negative for cough and shortness of breath.   Cardiovascular:  Negative for chest pain and palpitations.  Gastrointestinal:  Positive for abdominal pain. Negative for vomiting.  Genitourinary:  Positive for flank pain. Negative for dysuria and hematuria.  Musculoskeletal:  Negative for arthralgias and back pain.  Skin:  Negative for color change and rash.  Neurological:  Negative for seizures and syncope.  All other systems reviewed and are negative.  Physical Exam Updated Vital Signs BP (!) 158/87 (BP Location: Right Arm)   Pulse 79   Temp 97.6 F (36.4 C) (Oral)   Resp 14   Ht 5\' 7"  (1.702 m)   Wt 59 kg   SpO2 100%  BMI 20.36 kg/m   Physical Exam Vitals and nursing note reviewed.  Constitutional:      Appearance: He is well-developed.  HENT:     Head: Normocephalic and atraumatic.  Eyes:     Conjunctiva/sclera: Conjunctivae normal.  Cardiovascular:     Rate and Rhythm: Normal rate and regular rhythm.     Heart sounds: No murmur heard. Pulmonary:     Effort: Pulmonary effort is normal. No respiratory distress.     Breath sounds: Normal breath sounds.  Abdominal:     Palpations: Abdomen is soft.     Tenderness: There is abdominal tenderness in the left lower quadrant. There is left CVA tenderness.   Musculoskeletal:     Cervical back: Neck supple.  Skin:    General: Skin is warm and dry.  Neurological:     Mental Status: He is alert.    ED Results / Procedures / Treatments   Labs (all labs ordered are listed, but only abnormal results are displayed) Labs Reviewed  BASIC METABOLIC PANEL - Abnormal; Notable for the following components:      Result Value   Glucose, Bld 137 (*)    BUN 26 (*)    Creatinine, Ser 1.40 (*)    GFR, Estimated 54 (*)    All other components within normal limits  URINALYSIS, ROUTINE W REFLEX MICROSCOPIC - Abnormal; Notable for the following components:   Hgb urine dipstick SMALL (*)    Leukocytes,Ua SMALL (*)    All other components within normal limits  CBC WITH DIFFERENTIAL/PLATELET - Abnormal; Notable for the following components:   Hemoglobin 11.3 (*)    HCT 36.0 (*)    MCV 79.1 (*)    MCH 24.8 (*)    All other components within normal limits    EKG None  Radiology CT ABDOMEN PELVIS W CONTRAST  Result Date: 09/05/2021 CLINICAL DATA:  Left lower quadrant and flank pain, kidney stone or diverticulitis suspected EXAM: CT ABDOMEN AND PELVIS WITH CONTRAST TECHNIQUE: Multidetector CT imaging of the abdomen and pelvis was performed using the standard protocol following bolus administration of intravenous contrast. CONTRAST:  6mL OMNIPAQUE IOHEXOL 300 MG/ML  SOLN COMPARISON:  08/24/2021 FINDINGS: Lower chest: No acute abnormality. Scattered left coronary artery calcifications. Hepatobiliary: No solid liver abnormality is seen. No gallstones, gallbladder wall thickening, or biliary dilatation. Pancreas: Unremarkable. No pancreatic ductal dilatation or surrounding inflammatory changes. Spleen: Normal in size without significant abnormality. Adrenals/Urinary Tract: Adrenal glands are unremarkable. Redemonstrated moderate left hydronephrosis and hydroureter, with a 0.6 cm calculus again seen in the distal third of the left ureter, just distal to the left  iliac vessels, with minimal distal migration in the interval to prior examination. Delayed left nephrogram. No right-sided calculi or right-sided hydronephrosis bladder is unremarkable. Stomach/Bowel: Stomach is within normal limits. Appendix appears normal. No evidence of bowel wall thickening, distention, or inflammatory changes. Vascular/Lymphatic: Aortic atherosclerosis. No enlarged abdominal or pelvic lymph nodes. Reproductive: Gross prostatomegaly. Other: No abdominal wall hernia or abnormality. No abdominopelvic ascites. Musculoskeletal: No acute or significant osseous findings. IMPRESSION: 1. Redemonstrated moderate left hydronephrosis and hydroureter, with a 0.6 cm calculus again seen in the distal third of the left ureter, just distal to the left iliac vessels, with minimal distal migration in the interval to prior examination. Delayed left nephrogram. 2. No evidence of acute diverticulitis or significant diverticular disease per clinical concern. 3. Gross prostatomegaly. 4. Coronary artery disease. Aortic Atherosclerosis (ICD10-I70.0). Electronically Signed   By: Bonna Gains.D.  On: 09/05/2021 10:14    Procedures Procedures   Medications Ordered in ED Medications  lactated ringers bolus 1,000 mL (0 mLs Intravenous Stopped 09/05/21 1117)  morphine 4 MG/ML injection 3 mg (3 mg Intravenous Given 09/05/21 0927)  ondansetron (ZOFRAN) injection 4 mg (4 mg Intravenous Given 09/05/21 0927)  iohexol (OMNIPAQUE) 300 MG/ML solution 70 mL (70 mLs Intravenous Contrast Given 09/05/21 1008)  morphine 2 MG/ML injection 2 mg (2 mg Intravenous Given 09/05/21 1118)  ketorolac (TORADOL) 15 MG/ML injection 15 mg (15 mg Intravenous Given 09/05/21 1118)    ED Course  I have reviewed the triage vital signs and the nursing notes.  Pertinent labs & imaging results that were available during my care of the patient were reviewed by me and considered in my medical decision making (see chart for details).     MDM Rules/Calculators/A&P                           Patient seen emergency department for evaluation of left lower quadrant abdominal pain and flank pain.  Physical exam reveals tenderness in left lower quadrant and over the left CVA.  Laboratory evaluation reveals an elevated BUN 26, creatinine to 1.40 which is an elevation from his previous evaluation here in the emergency department.  CT abdomen pelvis reveals a 6 mm stone that has not significantly advanced from previous evaluation.  There is also mild hydro-.  I spoke with urology who agrees that the patient can follow-up outpatient for likely lithotripsy.  Resources provided for [facility] urology and patient discharged with Naprosyn and a breakthrough prescription of oxycodone. Final Clinical Impression(s) / ED Diagnoses Final diagnoses:  Nephrolithiasis    Rx / DC Orders ED Discharge Orders          Ordered    naproxen (NAPROSYN) 500 MG tablet  2 times daily        09/05/21 1046    oxyCODONE (ROXICODONE) 5 MG immediate release tablet  Every 4 hours PRN        09/05/21 1046             Blythe Veach, Debe Coder, MD 09/05/21 1501

## 2021-09-05 NOTE — ED Provider Notes (Signed)
MSE was initiated and I personally evaluated the patient and placed orders (if any) at  5:50 AM on September 05, 2021.  Patient recently diagnosed with left kidney stone (10/29). Reports he felt better with the medications provided from that time until today. No fever. No hematuria or difficulty urinating.   Today's Vitals   09/05/21 0541  BP: 137/83  Pulse: 76  Resp: 18  Temp: 98.2 F (36.8 C)  TempSrc: Oral  SpO2: 98%   There is no height or weight on file to calculate BMI.  Elderly patient in NAD   The patient appears stable so that the remainder of the MSE may be completed by another provider.   Elpidio Anis, PA-C 09/05/21 5400    Marily Memos, MD 09/05/21 [phone]

## 2021-09-05 NOTE — ED Triage Notes (Signed)
Patient c/o left side pain states he was here 2 weeks ago for same and was told he had Kidney stones.

## 2021-09-10 ENCOUNTER — Ambulatory Visit: Payer: Medicare PPO | Admitting: Urology

## 2021-09-10 ENCOUNTER — Encounter: Payer: Self-pay | Admitting: Urology

## 2021-09-10 ENCOUNTER — Other Ambulatory Visit: Payer: Self-pay

## 2021-09-10 ENCOUNTER — Ambulatory Visit (HOSPITAL_COMMUNITY)
Admission: RE | Admit: 2021-09-10 | Discharge: 2021-09-10 | Disposition: A | Discharge status: Home / Self Care | Payer: Medicare PPO | Source: Ambulatory Visit | Attending: Urology | Admitting: Urology

## 2021-09-10 VITALS — BP 169/81 | HR 86 | Temp 98.1°F

## 2021-09-10 DIAGNOSIS — N201 Calculus of ureter: Secondary | ICD-10-CM | POA: Diagnosis not present

## 2021-09-10 MED ORDER — TAMSULOSIN HCL 0.4 MG PO CAPS
0.4000 mg | ORAL_CAPSULE | Freq: Every day | ORAL | 1 refills | Status: DC
Start: 2021-09-10 — End: 2021-09-17

## 2021-09-10 NOTE — Progress Notes (Addendum)
Assessment: 1. Ureteral calculus, left     Plan: I personally reviewed the patient's CT studies from 08/24/2021 and 09/05/2021 showing a 6 mm calculus in the left ureter with distal migration.  I reviewed these films with the patient today. Diagnosis and management of a ureteral calculus discussed with the patient in detail.  Options for management including spontaneous passage, shockwave lithotripsy, and ureteroscopic stone manipulation with laser lithotripsy discussed with the patient.  Risk and benefits of each treatment reviewed.  Following our discussion, he would like to proceed with shockwave lithotripsy. Continue tamsulosin. Strain urine. Pain medicine as needed. KUB today.  Procedure: The patient will be scheduled for [facility] ESL at [facility].  Surgical request is placed with the surgery schedulers and will be scheduled at the patient's/family request. Informed consent is given as documented below. Anesthesia:  local  The patient does not have sleep apnea, history of MRSA, history of VRE, history of cardiac device requiring special anesthetic needs. Patient is stable and considered clear for surgical in an outpatient ambulatory surgery setting as well as patient hospital setting.  Consent for Operation or Procedure: Provider Certification I hereby certify that the nature, purpose, benefits, usual and most frequent risks of, and alternatives to, the operation or procedure have been explained to the patient (or person authorized to sign for the patient) either by me as responsible physician or by the provider who is to perform the operation or procedure. Time spent such that the patient/family has had an opportunity to ask questions, and that those questions have been answered. The patient or the patient's representative has been advised that selected tasks may be performed by assistants to the primary health care provider(s). I believe that the patient (or person authorized to sign for  the patient) understands what has been explained, and has consented to the operation or procedure. No guarantees were implied or made.   Chief Complaint:  Chief Complaint  Patient presents with   Ureteral caclulus     History of Present Illness:  Oscar Morse is a 70 y.o. year old male who is seen in consultation from [place], [place], FNP for evaluation of a left ureteral calculus.  He presented to the emergency room on 08/24/2021 with left lower abdominal pain.  CT imaging showed a 5 x 7 x 7 mm calculus in the left mid ureter with mild left hydronephrosis.  He was treated with pain medicine and Flomax.  His symptoms resolved.  He returned to the emergency room on 09/05/2021 with return of his left lower abdominal pain.  Repeat CT imaging showed distal migration of a 6 mm calculus now in the distal third of the left ureter with moderate left hydronephrosis.  He has been taking Naprosyn with relief of his pain.  He has not been using Flomax on a regular basis.  He has not been straining his urine.  No fevers or chills.  No nausea or vomiting.  No prior history of kidney stones.  No dysuria or gross hematuria.   Past Medical History:  Past Medical History:  Diagnosis Date   Anemia     Past Surgical History:  History reviewed. No pertinent surgical history.  Allergies:  No Known Allergies  Family History:  History reviewed. No pertinent family history.  Social History:  Social History   Tobacco Use   Smoking status: Never   Smokeless tobacco: Never  Vaping Use   Vaping Use: Never used  Substance Use Topics   Alcohol use:  Never   Drug use: Never    Review of symptoms:  Constitutional:  Negative for unexplained weight loss, night sweats, fever, chills ENT:  Negative for nose bleeds, sinus pain, painful swallowing CV:  Negative for chest pain, shortness of breath, exercise intolerance, palpitations, loss of consciousness Resp:  Negative for cough, wheezing, shortness of  breath GI:  Negative for nausea, vomiting, diarrhea, bloody stools GU:  Positives noted in HPI; otherwise negative for gross hematuria, dysuria, urinary incontinence Neuro:  Negative for seizures, poor balance, limb weakness, slurred speech Psych:  Negative for lack of energy, depression, anxiety Endocrine:  Negative for polydipsia, polyuria, symptoms of hypoglycemia (dizziness, hunger, sweating) Hematologic:  Negative for anemia, purpura, petechia, prolonged or excessive bleeding, use of anticoagulants  Allergic:  Negative for difficulty breathing or choking as a result of exposure to anything; no shellfish allergy; no allergic response (rash/itch) to materials, foods  Physical exam: BP (!) 169/81   Pulse 86   Temp 98.1 F (36.7 C)  GENERAL APPEARANCE:  Well appearing, well developed, well nourished, NAD HEENT: Atraumatic, Normocephalic, oropharynx clear. NECK: Supple without lymphadenopathy or thyromegaly. LUNGS: Clear to auscultation bilaterally. HEART: Regular Rate and Rhythm without murmurs, gallops, or rubs. ABDOMEN: Soft, non-tender, No Masses. EXTREMITIES: Moves all extremities well.  Without clubbing, cyanosis, or edema. NEUROLOGIC:  Alert and oriented x 3, normal gait, CN II-XII grossly intact.  MENTAL STATUS:  Appropriate. BACK:  Non-tender to palpation.  No CVAT SKIN:  Warm, dry and intact.    Results: U/A:  0-5 WBC, 0-2 RBC, few bacteria  CT ABDOMEN AND PELVIS WITH CONTRAST   TECHNIQUE: Multidetector CT imaging of the abdomen and pelvis was performed using the standard protocol following bolus administration of intravenous contrast.   CONTRAST:  47mL OMNIPAQUE IOHEXOL 300 MG/ML  SOLN   COMPARISON:  08/24/2021   FINDINGS: Lower chest: No acute abnormality. Scattered left coronary artery calcifications.   Hepatobiliary: No solid liver abnormality is seen. No gallstones, gallbladder wall thickening, or biliary dilatation.   Pancreas: Unremarkable. No  pancreatic ductal dilatation or surrounding inflammatory changes.   Spleen: Normal in size without significant abnormality.   Adrenals/Urinary Tract: Adrenal glands are unremarkable. Redemonstrated moderate left hydronephrosis and hydroureter, with a 0.6 cm calculus again seen in the distal third of the left ureter, just distal to the left iliac vessels, with minimal distal migration in the interval to prior examination. Delayed left nephrogram. No right-sided calculi or right-sided hydronephrosis bladder is unremarkable.   Stomach/Bowel: Stomach is within normal limits. Appendix appears normal. No evidence of bowel wall thickening, distention, or inflammatory changes.   Vascular/Lymphatic: Aortic atherosclerosis. No enlarged abdominal or pelvic lymph nodes.   Reproductive: Gross prostatomegaly.   Other: No abdominal wall hernia or abnormality. No abdominopelvic ascites.   Musculoskeletal: No acute or significant osseous findings.   IMPRESSION: 1. Redemonstrated moderate left hydronephrosis and hydroureter, with a 0.6 cm calculus again seen in the distal third of the left ureter, just distal to the left iliac vessels, with minimal distal migration in the interval to prior examination. Delayed left nephrogram. 2. No evidence of acute diverticulitis or significant diverticular disease per clinical concern. 3. Gross prostatomegaly. 4. Coronary artery disease.   Aortic Atherosclerosis (ICD10-I70.0).     Electronically Signed   By: Jearld Lesch M.D.   On: 09/05/2021 10:14

## 2021-09-10 NOTE — Progress Notes (Signed)
Urological Symptom Review  Patient is experiencing the following symptoms: Kidney stones   Review of Systems  Gastrointestinal (upper)  : Negative for upper GI symptoms  Gastrointestinal (lower) : Negative for lower GI symptoms  Constitutional : Negative for symptoms  Skin: Negative for skin symptoms  Eyes: Negative for eye symptoms  Ear/Nose/Throat : Negative for Ear/Nose/Throat symptoms  Hematologic/Lymphatic: Negative for Hematologic/Lymphatic symptoms  Cardiovascular : Negative for cardiovascular symptoms  Respiratory : Negative for respiratory symptoms  Endocrine: Negative for endocrine symptoms  Musculoskeletal: Negative for musculoskeletal symptoms  Neurological: Negative for neurological symptoms  Psychologic: Negative for psychiatric symptoms  

## 2021-09-10 NOTE — H&P (View-Only) (Signed)
Assessment: 1. Ureteral calculus, left     Plan: I personally reviewed the patient's CT studies from 08/24/2021 and 09/05/2021 showing a 6 mm calculus in the left ureter with distal migration.  I reviewed these films with the patient today. Diagnosis and management of a ureteral calculus discussed with the patient in detail.  Options for management including spontaneous passage, shockwave lithotripsy, and ureteroscopic stone manipulation with laser lithotripsy discussed with the patient.  Risk and benefits of each treatment reviewed.  Following our discussion, he would like to proceed with shockwave lithotripsy. Continue tamsulosin. Strain urine. Pain medicine as needed. KUB today.  Procedure: The patient will be scheduled for left ESL at [facility].  Surgical request is placed with the surgery schedulers and will be scheduled at the patient's/family request. Informed consent is given as documented below. Anesthesia:  local  The patient does not have sleep apnea, history of MRSA, history of VRE, history of cardiac device requiring special anesthetic needs. Patient is stable and considered clear for surgical in an outpatient ambulatory surgery setting as well as patient hospital setting.  Consent for Operation or Procedure: Provider Certification I hereby certify that the nature, purpose, benefits, usual and most frequent risks of, and alternatives to, the operation or procedure have been explained to the patient (or person authorized to sign for the patient) either by me as responsible physician or by the provider who is to perform the operation or procedure. Time spent such that the patient/family has had an opportunity to ask questions, and that those questions have been answered. The patient or the patient's representative has been advised that selected tasks may be performed by assistants to the primary health care provider(s). I believe that the patient (or person authorized to sign for  the patient) understands what has been explained, and has consented to the operation or procedure. No guarantees were implied or made.   Chief Complaint:  Chief Complaint  Patient presents with   Ureteral caclulus     History of Present Illness:  Oscar Morse is a 70 y.o. year old male who is seen in consultation from [place], Harrells, FNP for evaluation of a left ureteral calculus.  He presented to the emergency room on 08/24/2021 with left lower abdominal pain.  CT imaging showed a 5 x 7 x 7 mm calculus in the left mid ureter with mild left hydronephrosis.  He was treated with pain medicine and Flomax.  His symptoms resolved.  He returned to the emergency room on 09/05/2021 with return of his left lower abdominal pain.  Repeat CT imaging showed distal migration of a 6 mm calculus now in the distal third of the left ureter with moderate left hydronephrosis.  He has been taking Naprosyn with relief of his pain.  He has not been using Flomax on a regular basis.  He has not been straining his urine.  No fevers or chills.  No nausea or vomiting.  No prior history of kidney stones.  No dysuria or gross hematuria.   Past Medical History:  Past Medical History:  Diagnosis Date   Anemia     Past Surgical History:  History reviewed. No pertinent surgical history.  Allergies:  No Known Allergies  Family History:  History reviewed. No pertinent family history.  Social History:  Social History   Tobacco Use   Smoking status: Never   Smokeless tobacco: Never  Vaping Use   Vaping Use: Never used  Substance Use Topics   Alcohol use:  Never   Drug use: Never    Review of symptoms:  Constitutional:  Negative for unexplained weight loss, night sweats, fever, chills ENT:  Negative for nose bleeds, sinus pain, painful swallowing CV:  Negative for chest pain, shortness of breath, exercise intolerance, palpitations, loss of consciousness Resp:  Negative for cough, wheezing, shortness of  breath GI:  Negative for nausea, vomiting, diarrhea, bloody stools GU:  Positives noted in HPI; otherwise negative for gross hematuria, dysuria, urinary incontinence Neuro:  Negative for seizures, poor balance, limb weakness, slurred speech Psych:  Negative for lack of energy, depression, anxiety Endocrine:  Negative for polydipsia, polyuria, symptoms of hypoglycemia (dizziness, hunger, sweating) Hematologic:  Negative for anemia, purpura, petechia, prolonged or excessive bleeding, use of anticoagulants  Allergic:  Negative for difficulty breathing or choking as a result of exposure to anything; no shellfish allergy; no allergic response (rash/itch) to materials, foods  Physical exam: BP (!) 169/81   Pulse 86   Temp 98.1 F (36.7 C)  GENERAL APPEARANCE:  Well appearing, well developed, well nourished, NAD HEENT: Atraumatic, Normocephalic, oropharynx clear. NECK: Supple without lymphadenopathy or thyromegaly. LUNGS: Clear to auscultation bilaterally. HEART: Regular Rate and Rhythm without murmurs, gallops, or rubs. ABDOMEN: Soft, non-tender, No Masses. EXTREMITIES: Moves all extremities well.  Without clubbing, cyanosis, or edema. NEUROLOGIC:  Alert and oriented x 3, normal gait, CN II-XII grossly intact.  MENTAL STATUS:  Appropriate. BACK:  Non-tender to palpation.  No CVAT SKIN:  Warm, dry and intact.    Results: U/A:  0-5 WBC, 0-2 RBC, few bacteria  CT ABDOMEN AND PELVIS WITH CONTRAST   TECHNIQUE: Multidetector CT imaging of the abdomen and pelvis was performed using the standard protocol following bolus administration of intravenous contrast.   CONTRAST:  47mL OMNIPAQUE IOHEXOL 300 MG/ML  SOLN   COMPARISON:  08/24/2021   FINDINGS: Lower chest: No acute abnormality. Scattered left coronary artery calcifications.   Hepatobiliary: No solid liver abnormality is seen. No gallstones, gallbladder wall thickening, or biliary dilatation.   Pancreas: Unremarkable. No  pancreatic ductal dilatation or surrounding inflammatory changes.   Spleen: Normal in size without significant abnormality.   Adrenals/Urinary Tract: Adrenal glands are unremarkable. Redemonstrated moderate left hydronephrosis and hydroureter, with a 0.6 cm calculus again seen in the distal third of the left ureter, just distal to the left iliac vessels, with minimal distal migration in the interval to prior examination. Delayed left nephrogram. No right-sided calculi or right-sided hydronephrosis bladder is unremarkable.   Stomach/Bowel: Stomach is within normal limits. Appendix appears normal. No evidence of bowel wall thickening, distention, or inflammatory changes.   Vascular/Lymphatic: Aortic atherosclerosis. No enlarged abdominal or pelvic lymph nodes.   Reproductive: Gross prostatomegaly.   Other: No abdominal wall hernia or abnormality. No abdominopelvic ascites.   Musculoskeletal: No acute or significant osseous findings.   IMPRESSION: 1. Redemonstrated moderate left hydronephrosis and hydroureter, with a 0.6 cm calculus again seen in the distal third of the left ureter, just distal to the left iliac vessels, with minimal distal migration in the interval to prior examination. Delayed left nephrogram. 2. No evidence of acute diverticulitis or significant diverticular disease per clinical concern. 3. Gross prostatomegaly. 4. Coronary artery disease.   Aortic Atherosclerosis (ICD10-I70.0).     Electronically Signed   By: Jearld Lesch M.D.   On: 09/05/2021 10:14

## 2021-09-11 LAB — URINALYSIS, ROUTINE W REFLEX MICROSCOPIC
Bilirubin, UA: NEGATIVE
Glucose, UA: NEGATIVE
Ketones, UA: NEGATIVE
Nitrite, UA: NEGATIVE
Protein,UA: NEGATIVE
Specific Gravity, UA: 1.02 (ref 1.005–1.030)
Urobilinogen, Ur: 0.2 mg/dL (ref 0.2–1.0)
pH, UA: 6.5 (ref 5.0–7.5)

## 2021-09-11 LAB — MICROSCOPIC EXAMINATION: Epithelial Cells (non renal): NONE SEEN /hpf (ref 0–10)

## 2021-09-12 ENCOUNTER — Other Ambulatory Visit: Payer: Self-pay

## 2021-09-12 ENCOUNTER — Encounter (HOSPITAL_COMMUNITY)
Admission: RE | Admit: 2021-09-12 | Discharge: 2021-09-12 | Disposition: A | Discharge status: Home / Self Care | Payer: Medicare PPO | Source: Ambulatory Visit | Attending: Urology | Admitting: Urology

## 2021-09-17 ENCOUNTER — Ambulatory Visit (HOSPITAL_COMMUNITY)
Admission: RE | Admit: 2021-09-17 | Discharge: 2021-09-17 | Disposition: A | Discharge status: Home / Self Care | Payer: Medicare PPO | Attending: Urology | Admitting: Urology

## 2021-09-17 ENCOUNTER — Ambulatory Visit (HOSPITAL_COMMUNITY): Payer: Medicare PPO

## 2021-09-17 ENCOUNTER — Encounter (HOSPITAL_COMMUNITY)
Admission: RE | Disposition: A | Discharge status: Home / Self Care | Payer: Self-pay | Source: Home / Self Care | Attending: Urology

## 2021-09-17 ENCOUNTER — Encounter (HOSPITAL_COMMUNITY): Payer: Self-pay | Admitting: Urology

## 2021-09-17 DIAGNOSIS — N201 Calculus of ureter: Secondary | ICD-10-CM | POA: Insufficient documentation

## 2021-09-17 HISTORY — PX: EXTRACORPOREAL SHOCK WAVE LITHOTRIPSY: SHX1557

## 2021-09-17 SURGERY — LITHOTRIPSY, ESWL
Anesthesia: LOCAL | Laterality: Left

## 2021-09-17 MED ORDER — DIPHENHYDRAMINE HCL 25 MG PO CAPS
25.0000 mg | ORAL_CAPSULE | ORAL | Status: AC
  Administered 2021-09-17: 25 mg via ORAL
  Filled 2021-09-17: qty 1

## 2021-09-17 MED ORDER — SODIUM CHLORIDE 0.9 % IV SOLN: Freq: Once | INTRAVENOUS | Status: AC

## 2021-09-17 MED ORDER — CIPROFLOXACIN HCL 500 MG PO TABS
500.0000 mg | ORAL_TABLET | ORAL | Status: AC
  Administered 2021-09-17: 500 mg via ORAL
  Filled 2021-09-17: qty 2
  Filled 2021-09-17: qty 1

## 2021-09-17 MED ORDER — TAMSULOSIN HCL 0.4 MG PO CAPS: 0.4000 mg | ORAL_CAPSULE | Freq: Every day | ORAL | 0 refills | Status: DC

## 2021-09-17 NOTE — Interval H&P Note (Signed)
History and Physical Interval Note:  09/17/2021 8:30 AM  Oscar Morse  has presented today for surgery, with the diagnosis of left ureteral calculus.  The various methods of treatment have been discussed with the patient and family. After consideration of risks, benefits and other options for treatment, the patient has consented to  Procedure(s): EXTRACORPOREAL SHOCK WAVE LITHOTRIPSY (ESWL) (Left) as a surgical intervention.  The patient's history has been reviewed, patient examined, no change in status, stable for surgery.  I have reviewed the patient's chart and labs.  Questions were answered to the patient's satisfaction.     [name]

## 2021-09-18 ENCOUNTER — Encounter (HOSPITAL_COMMUNITY): Payer: Self-pay | Admitting: Urology

## 2021-09-24 ENCOUNTER — Ambulatory Visit: Payer: Medicare PPO | Admitting: Urology

## 2021-09-24 ENCOUNTER — Ambulatory Visit (HOSPITAL_COMMUNITY)
Admission: RE | Admit: 2021-09-24 | Discharge: 2021-09-24 | Disposition: A | Discharge status: Home / Self Care | Payer: Medicare PPO | Source: Ambulatory Visit | Attending: Urology | Admitting: Urology

## 2021-09-24 ENCOUNTER — Other Ambulatory Visit: Payer: Self-pay

## 2021-09-24 ENCOUNTER — Encounter: Payer: Self-pay | Admitting: Urology

## 2021-09-24 VITALS — BP 135/77 | HR 80 | Wt 130.0 lb

## 2021-09-24 DIAGNOSIS — N201 Calculus of ureter: Secondary | ICD-10-CM | POA: Insufficient documentation

## 2021-09-24 MED ORDER — TAMSULOSIN HCL 0.4 MG PO CAPS
0.4000 mg | ORAL_CAPSULE | Freq: Every day | ORAL | 0 refills | Status: DC
Start: 2021-09-24 — End: 2023-06-17

## 2021-09-24 MED ORDER — NAPROXEN 500 MG PO TABS
500.0000 mg | ORAL_TABLET | Freq: Two times a day (BID) | ORAL | 0 refills | Status: DC
Start: 2021-09-24 — End: 2023-06-15

## 2021-09-24 NOTE — Progress Notes (Signed)
   Assessment: 1. Ureteral calculus, left     Plan: KUB at [facility] today.  We will contact him with results. Continue tamsulosin.  Refill sent. Continue Naprosyn as needed.  Refill sent. Strain urine. Stone prevention discussed and information provided. Return to office in 1 month with KUB and renal ultrasound prior to visit.   Chief Complaint:  Chief Complaint  Patient presents with   ureteral calculus    History of Present Illness:  Oscar Morse is a 70 y.o. year old male who is seen for further evaluation of a left ureteral calculus.  He presented to the emergency room on 08/24/2021 with left lower abdominal pain.  CT imaging showed a 5 x 7 x 7 mm calculus in the left mid ureter with mild left hydronephrosis.  He was treated with pain medicine and Flomax.  His symptoms resolved.  He returned to the emergency room on 09/05/2021 with return of his left lower abdominal pain.  Repeat CT imaging showed distal migration of a 6 mm calculus now in the distal third of the left ureter with moderate left hydronephrosis.  He has been taking Naprosyn with relief of his pain.  He has not been using Flomax on a regular basis.  He has not been straining his urine.  No fevers or chills.  No nausea or vomiting.  No prior history of kidney stones.  No dysuria or gross hematuria. He underwent left ESL on 09/17/2021.  He returns today for follow-up.  He reports passing a number of stone fragments following lithotripsy.  He has not had any significant left-sided flank pain.  No dysuria or gross hematuria.  No fevers, chills, nausea, or vomiting.  He recently ran out of tamsulosin.  He is taking Naprosyn as needed.   Portions of the above documentation were copied from a prior visit for review purposes only.  Past Medical History:  Past Medical History:  Diagnosis Date   Anemia     Past Surgical History:  Past Surgical History:  Procedure Laterality Date   EXTRACORPOREAL SHOCK WAVE  LITHOTRIPSY Left 09/17/2021   Procedure: EXTRACORPOREAL SHOCK WAVE LITHOTRIPSY (ESWL);  Surgeon: Milderd Meager., MD;  Location: [facility];  Service: Urology;  Laterality: Left;    Allergies:  No Known Allergies  Family History:  History reviewed. No pertinent family history.  Social History:  Social History   Tobacco Use   Smoking status: Never   Smokeless tobacco: Never  Vaping Use   Vaping Use: Never used  Substance Use Topics   Alcohol use: Never   Drug use: Never    ROS: Constitutional:  Negative for fever, chills, weight loss CV: Negative for chest pain, previous MI, hypertension Respiratory:  Negative for shortness of breath, wheezing, sleep apnea, frequent cough GI:  Negative for nausea, vomiting, bloody stool, GERD  Physical exam: BP 135/77   Pulse 80   Wt 130 lb (59 kg)   BMI 20.36 kg/m  GENERAL APPEARANCE:  Well appearing, well developed, well nourished, NAD HEENT:  Atraumatic, normocephalic, oropharynx clear NECK:  Supple without lymphadenopathy or thyromegaly ABDOMEN:  Soft, non-tender, no masses EXTREMITIES:  Moves all extremities well, without clubbing, cyanosis, or edema NEUROLOGIC:  Alert and oriented x 3, normal gait, CN II-XII grossly intact MENTAL STATUS:  appropriate BACK:  Non-tender to palpation, No CVAT SKIN:  Warm, dry, and intact   Results: U/A:  0-5 WBC, >30 RBC

## 2021-09-24 NOTE — Progress Notes (Signed)

## 2021-09-27 NOTE — Progress Notes (Signed)
Called patient. No answer. Results mailed.  

## 2021-10-03 LAB — MICROSCOPIC EXAMINATION
Bacteria, UA: NONE SEEN
Epithelial Cells (non renal): NONE SEEN /hpf (ref 0–10)
RBC, Urine: >30 /hpf — AB (ref 0–2)
Renal Epithel, UA: NONE SEEN /hpf

## 2021-10-03 LAB — URINALYSIS, ROUTINE W REFLEX MICROSCOPIC
Bilirubin, UA: NEGATIVE
Glucose, UA: NEGATIVE
Ketones, UA: NEGATIVE
Nitrite, UA: NEGATIVE
Protein,UA: NEGATIVE
Specific Gravity, UA: 1.025 (ref 1.005–1.030)
Urobilinogen, Ur: 0.2 mg/dL (ref 0.2–1.0)
pH, UA: 6 (ref 5.0–7.5)

## 2021-10-24 ENCOUNTER — Ambulatory Visit: Payer: Medicare PPO | Admitting: Urology

## 2021-10-25 ENCOUNTER — Other Ambulatory Visit (HOSPITAL_COMMUNITY): Payer: Medicare PPO

## 2021-10-29 ENCOUNTER — Other Ambulatory Visit: Payer: Self-pay

## 2021-10-29 ENCOUNTER — Ambulatory Visit (HOSPITAL_COMMUNITY)
Admission: RE | Admit: 2021-10-29 | Discharge: 2021-10-29 | Disposition: A | Discharge status: Home / Self Care | Payer: Medicare PPO | Source: Ambulatory Visit | Attending: Urology | Admitting: Urology

## 2021-10-29 DIAGNOSIS — N201 Calculus of ureter: Secondary | ICD-10-CM | POA: Diagnosis not present

## 2021-11-01 ENCOUNTER — Encounter: Payer: Self-pay | Admitting: Urology

## 2021-11-01 ENCOUNTER — Ambulatory Visit (HOSPITAL_COMMUNITY)
Admission: RE | Admit: 2021-11-01 | Discharge: 2021-11-01 | Disposition: A | Discharge status: Home / Self Care | Payer: Medicare PPO | Source: Ambulatory Visit | Attending: Urology | Admitting: Urology

## 2021-11-01 ENCOUNTER — Other Ambulatory Visit: Payer: Self-pay

## 2021-11-01 ENCOUNTER — Ambulatory Visit: Payer: Medicare PPO | Admitting: Urology

## 2021-11-01 VITALS — BP 135/76 | HR 87 | Ht 67.0 in | Wt 130.0 lb

## 2021-11-01 DIAGNOSIS — N201 Calculus of ureter: Secondary | ICD-10-CM | POA: Diagnosis not present

## 2021-11-01 LAB — URINALYSIS, ROUTINE W REFLEX MICROSCOPIC
Bilirubin, UA: NEGATIVE
Glucose, UA: NEGATIVE
Ketones, UA: NEGATIVE
Leukocytes,UA: NEGATIVE
Nitrite, UA: NEGATIVE
Protein,UA: NEGATIVE
RBC, UA: NEGATIVE
Specific Gravity, UA: 1.02 (ref 1.005–1.030)
Urobilinogen, Ur: 0.2 mg/dL (ref 0.2–1.0)
pH, UA: 6 (ref 5.0–7.5)

## 2021-11-01 NOTE — Progress Notes (Signed)
Assessment: 1. Ureteral calculus, left     Plan: I reviewed the patient's recent imaging results. [name] to confirm passage of stone fragments Will call with results Stone prevention discussed and information provided. Return to office in 3 months  Chief Complaint:  Chief Complaint  Patient presents with   ureteral calculus    History of Present Illness:  Oscar Morse is a 71 y.o. year old male who is seen for further evaluation of a left ureteral calculus.  He presented to the emergency room on 08/24/2021 with left lower abdominal pain.  CT imaging showed a 5 x 7 x 7 mm calculus in the left mid ureter with mild left hydronephrosis.  He was treated with pain medicine and Flomax.  His symptoms resolved.  He returned to the emergency room on 09/05/2021 with return of his left lower abdominal pain.  Repeat CT imaging showed distal migration of a 6 mm calculus now in the distal third of the left ureter with moderate left hydronephrosis.  He had been taking Naprosyn with relief of his pain.   He underwent left ESL on 09/17/2021. He reported passing a number of stone fragments following lithotripsy.  No significant left-sided flank pain.  No dysuria or gross hematuria.  No fevers, chills, nausea, or vomiting.   KUB from 09/24/2021 showed a new 4 mm calcification in the left hemipelvis suspicious for a distal ureteral calculus versus bladder calcification. Renal ultrasound from 10/29/2021 showed prominence of the left renal pelvis without dilation of the minor calyces, interval resolution of moderate left hydronephrosis, 7 mm hyperechoic focus in the dependent portion of the bladder, 2.1 x 0.8 cm focal bulge in the posterior margin of the urinary bladder felt to represent projection of the prostate into the bladder.  He returns today for follow-up.  He has not had any flank pain.  He is not aware of passing any stone fragments but has not been straining his urine on a regular basis.   No lower urinary tract symptoms.  No fevers, chills, nausea, or vomiting.  Portions of the above documentation were copied from a prior visit for review purposes only.  Past Medical History:  Past Medical History:  Diagnosis Date   Anemia     Past Surgical History:  Past Surgical History:  Procedure Laterality Date   EXTRACORPOREAL SHOCK WAVE LITHOTRIPSY Left 09/17/2021   Procedure: EXTRACORPOREAL SHOCK WAVE LITHOTRIPSY (ESWL);  Surgeon: Milderd MeagerStoneking, Lynnwood Beckford J., MD;  Location: [facility];  Service: Urology;  Laterality: Left;    Allergies:  No Known Allergies  Family History:  No family history on file.  Social History:  Social History   Tobacco Use   Smoking status: Never   Smokeless tobacco: Never  Vaping Use   Vaping Use: Never used  Substance Use Topics   Alcohol use: Never   Drug use: Never    ROS: Constitutional:  Negative for fever, chills, weight loss CV: Negative for chest pain, previous MI, hypertension Respiratory:  Negative for shortness of breath, wheezing, sleep apnea, frequent cough GI:  Negative for nausea, vomiting, bloody stool, GERD  Physical exam: BP 135/76 (BP Location: Left Arm)    Pulse 87    Ht 5\' 7"  (1.702 m)    Wt 130 lb (59 kg)    BMI 20.36 kg/m  GENERAL APPEARANCE:  Well appearing, well developed, well nourished, NAD HEENT:  Atraumatic, normocephalic, oropharynx clear NECK:  Supple without lymphadenopathy or thyromegaly ABDOMEN:  Soft, non-tender, no masses EXTREMITIES:  Moves all extremities well, without clubbing, cyanosis, or edema NEUROLOGIC:  Alert and oriented x 3, normal gait, CN II-XII grossly intact MENTAL STATUS:  appropriate BACK:  Non-tender to palpation, No CVAT SKIN:  Warm, dry, and intact   Results: U/A dipstick negative

## 2021-11-01 NOTE — Progress Notes (Signed)

## 2021-11-04 ENCOUNTER — Telehealth: Payer: Self-pay

## 2021-11-04 ENCOUNTER — Other Ambulatory Visit: Payer: Self-pay | Admitting: Urology

## 2021-11-04 DIAGNOSIS — N201 Calculus of ureter: Secondary | ICD-10-CM

## 2021-11-04 NOTE — Telephone Encounter (Signed)
Called. Left message to return call 

## 2021-11-04 NOTE — Telephone Encounter (Signed)
-----   Message from Primus Bravo, MD sent at 11/04/2021  8:48 AM EST ----- Please notify patient that his KUB still shows some possible stone fragments in the area of the distal left ureter.  Recommend a repeat KUB in about 4-6 weeks to make sure these have passed.  Order entered.

## 2021-11-05 NOTE — Telephone Encounter (Signed)
Patient returned call to office. Notified and voiced understanding.

## 2021-12-06 ENCOUNTER — Other Ambulatory Visit: Payer: Self-pay

## 2021-12-06 ENCOUNTER — Ambulatory Visit (HOSPITAL_COMMUNITY)
Admission: RE | Admit: 2021-12-06 | Discharge: 2021-12-06 | Disposition: A | Discharge status: Home / Self Care | Payer: Medicare PPO | Source: Ambulatory Visit | Attending: Urology | Admitting: Urology

## 2021-12-06 DIAGNOSIS — N201 Calculus of ureter: Secondary | ICD-10-CM | POA: Insufficient documentation

## 2021-12-10 ENCOUNTER — Telehealth: Payer: Self-pay

## 2021-12-10 NOTE — Telephone Encounter (Signed)
-----   Message from Milderd Meager, MD sent at 12/10/2021 12:55 PM EST ----- Please notify the patient that his recent x-ray does not show any obvious stones in the area of the distal left ureter.  Keep follow-up as scheduled.

## 2021-12-10 NOTE — Telephone Encounter (Signed)
Pt was called and unable to be reached. Message unavailable.

## 2022-01-30 ENCOUNTER — Ambulatory Visit (INDEPENDENT_AMBULATORY_CARE_PROVIDER_SITE_OTHER): Payer: Medicare PPO | Admitting: Urology

## 2022-01-30 ENCOUNTER — Encounter: Payer: Self-pay | Admitting: Urology

## 2022-01-30 VITALS — BP 144/86 | HR 81

## 2022-01-30 DIAGNOSIS — N201 Calculus of ureter: Secondary | ICD-10-CM | POA: Diagnosis not present

## 2022-01-30 LAB — URINALYSIS, ROUTINE W REFLEX MICROSCOPIC
Bilirubin, UA: NEGATIVE
Glucose, UA: NEGATIVE
Ketones, UA: NEGATIVE
Leukocytes,UA: NEGATIVE
Nitrite, UA: NEGATIVE
Specific Gravity, UA: >1.03 — ABNORMAL HIGH (ref 1.005–1.030)
Urobilinogen, Ur: 0.2 mg/dL (ref 0.2–1.0)
pH, UA: 5.5 (ref 5.0–7.5)

## 2022-01-30 LAB — MICROSCOPIC EXAMINATION
Bacteria, UA: NONE SEEN
Epithelial Cells (non renal): NONE SEEN /hpf (ref 0–10)
Renal Epithel, UA: NONE SEEN /hpf

## 2022-01-30 NOTE — Progress Notes (Signed)
? ?Assessment: ?1. Ureteral calculus, left   ? ? ?Plan: ?Continue stone prevention ?Return to office as needed ? ?Chief Complaint:  ?Chief Complaint  ?Patient presents with  ? Ureteral calculus  ? ? ?History of Present Illness: ? ?Oscar Morse is a 71 y.o. year old male who is seen for further evaluation of a left ureteral calculus.  He presented to the emergency room on 08/24/2021 with left lower abdominal pain.  CT imaging showed a 5 x 7 x 7 mm calculus in the left mid ureter with mild left hydronephrosis.  He was treated with pain medicine and Flomax.  His symptoms resolved.  He returned to the emergency room on 09/05/2021 with return of his left lower abdominal pain.  Repeat CT imaging showed distal migration of a 6 mm calculus now in the distal third of the left ureter with moderate left hydronephrosis.  He had been taking Naprosyn with relief of his pain.   ?He underwent left ESL on 09/17/2021. ?He reported passing a number of stone fragments following lithotripsy.  No significant left-sided flank pain.  No dysuria or gross hematuria.  No fevers, chills, nausea, or vomiting.   ?KUB from 09/24/2021 showed a new 4 mm calcification in the left hemipelvis suspicious for a distal ureteral calculus versus bladder calcification. ?Renal ultrasound from 10/29/2021 showed prominence of the left renal pelvis without dilation of the minor calyces, interval resolution of moderate left hydronephrosis, 7 mm hyperechoic focus in the dependent portion of the bladder, 2.1 x 0.8 cm focal bulge in the posterior margin of the urinary bladder felt to represent projection of the prostate into the bladder. ?KUB from 11/01/2021 showed multiple calcifications in the left pelvis possibly reflecting small multiple distal ureteral stones or bladder stones. ?KUB from 12/08/2021 showed no obvious renal or ureteral calculi. ? ?He returns today for scheduled follow-up.  He has not had any flank pain.  He has noted some intermittent discomfort  in the left lower abdomen.  No dysuria or gross hematuria. ? ?Portions of the above documentation were copied from a prior visit for review purposes only. ? ?Past Medical History:  ?Past Medical History:  ?Diagnosis Date  ? Anemia   ? ? ?Past Surgical History:  ?Past Surgical History:  ?Procedure Laterality Date  ? EXTRACORPOREAL SHOCK WAVE LITHOTRIPSY Left 09/17/2021  ? Procedure: EXTRACORPOREAL SHOCK WAVE LITHOTRIPSY (ESWL);  Surgeon: Milderd Meager., MD;  Location: [facility];  Service: Urology;  Laterality: Left;  ? ? ?Allergies:  ?No Known Allergies ? ?Family History:  ?No family history on file. ? ?Social History:  ?Social History  ? ?Tobacco Use  ? Smoking status: Never  ? Smokeless tobacco: Never  ?Vaping Use  ? Vaping Use: Never used  ?Substance Use Topics  ? Alcohol use: Never  ? Drug use: Never  ? ? ?ROS: ?Constitutional:  Negative for fever, chills, weight loss ?CV: Negative for chest pain, previous MI, hypertension ?Respiratory:  Negative for shortness of breath, wheezing, sleep apnea, frequent cough ?GI:  Negative for nausea, vomiting, bloody stool, GERD ? ?Physical exam: ?BP (!) 144/86   Pulse 81  ?GENERAL APPEARANCE:  Well appearing, well developed, well nourished, NAD ?HEENT:  Atraumatic, normocephalic, oropharynx clear ?NECK:  Supple without lymphadenopathy or thyromegaly ?ABDOMEN:  Soft, non-tender, no masses ?EXTREMITIES:  Moves all extremities well, without clubbing, cyanosis, or edema ?NEUROLOGIC:  Alert and oriented x 3, normal gait, CN II-XII grossly intact ?MENTAL STATUS:  appropriate ?BACK:  Non-tender to palpation, No CVAT ?SKIN:  Warm, dry, and intact ? ? ?Results: ?U/A: 0-5 WBC, 0-2 RBC ?

## 2022-01-31 ENCOUNTER — Ambulatory Visit: Payer: Medicare PPO | Admitting: Urology

## 2022-09-14 IMAGING — DX DG ABDOMEN 1V
1 series · 1 of 1 positions shown · non-contrast
Comparison: Abdomen 09/10/2021.  CT 09/05/2021.

CLINICAL DATA: Left-sided stone.  Pre lithotripsy.

EXAM:
ABDOMEN - 1 VIEW

[abdomen kub]
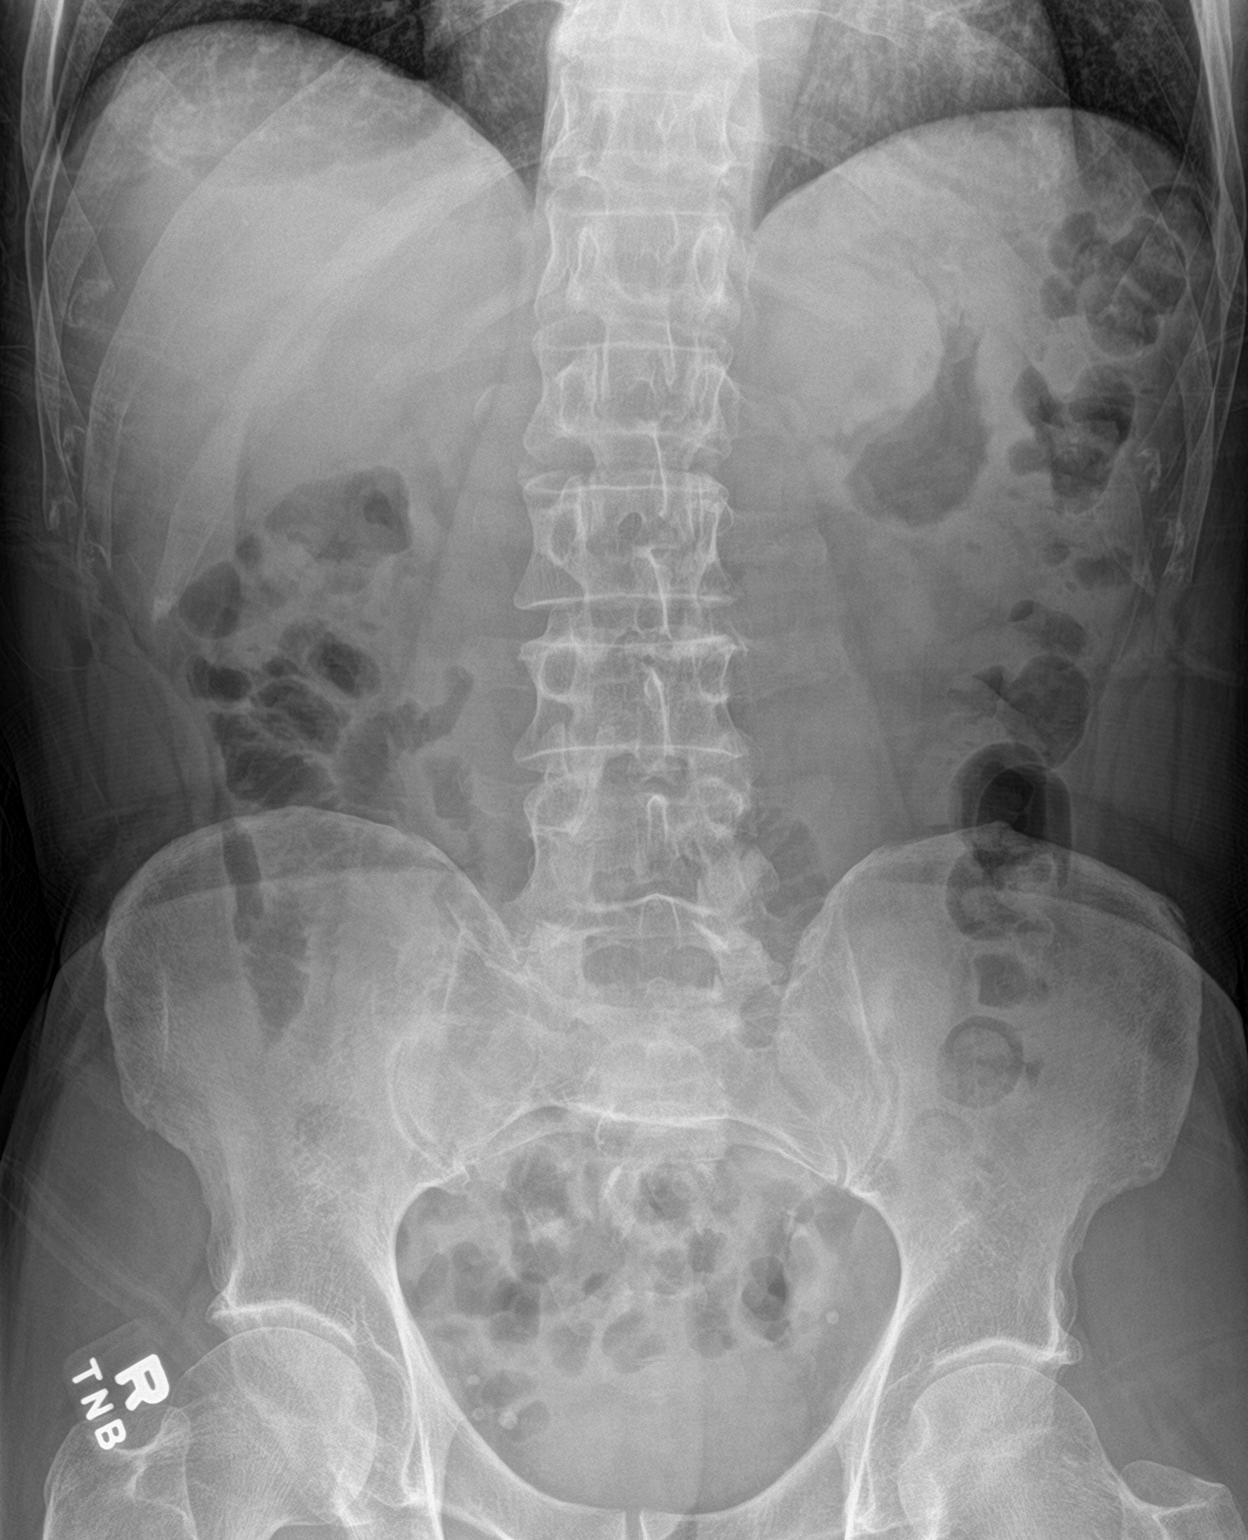

[1 of 1 positions shown; findings below may reference images not displayed]

FINDINGS: Stool and bowel gas make evaluation for stone disease difficult.
Previously identified distal left ureteral stone(s) again most
likely present. Stable pelvic calcifications consistent phleboliths.
No bowel distention. No free air. Mild lumbar scoliosis concave
left. Mild degenerative change lumbar spine and both hips.
IMPRESSION: Stool bowel gas make evaluation for stone disease difficult.
Previously identified distal left ureteral stone(s) again most
likely present.

## 2022-09-21 IMAGING — DX DG ABDOMEN 1V
1 series · 1 of 1 positions shown · non-contrast
Comparison: Abdominal x-ray 09/17/2021. CT abdomen and pelvis
09/05/2021.

CLINICAL DATA: Left ureteral calculus.

EXAM:
ABDOMEN - 1 VIEW

[abdomen kub]
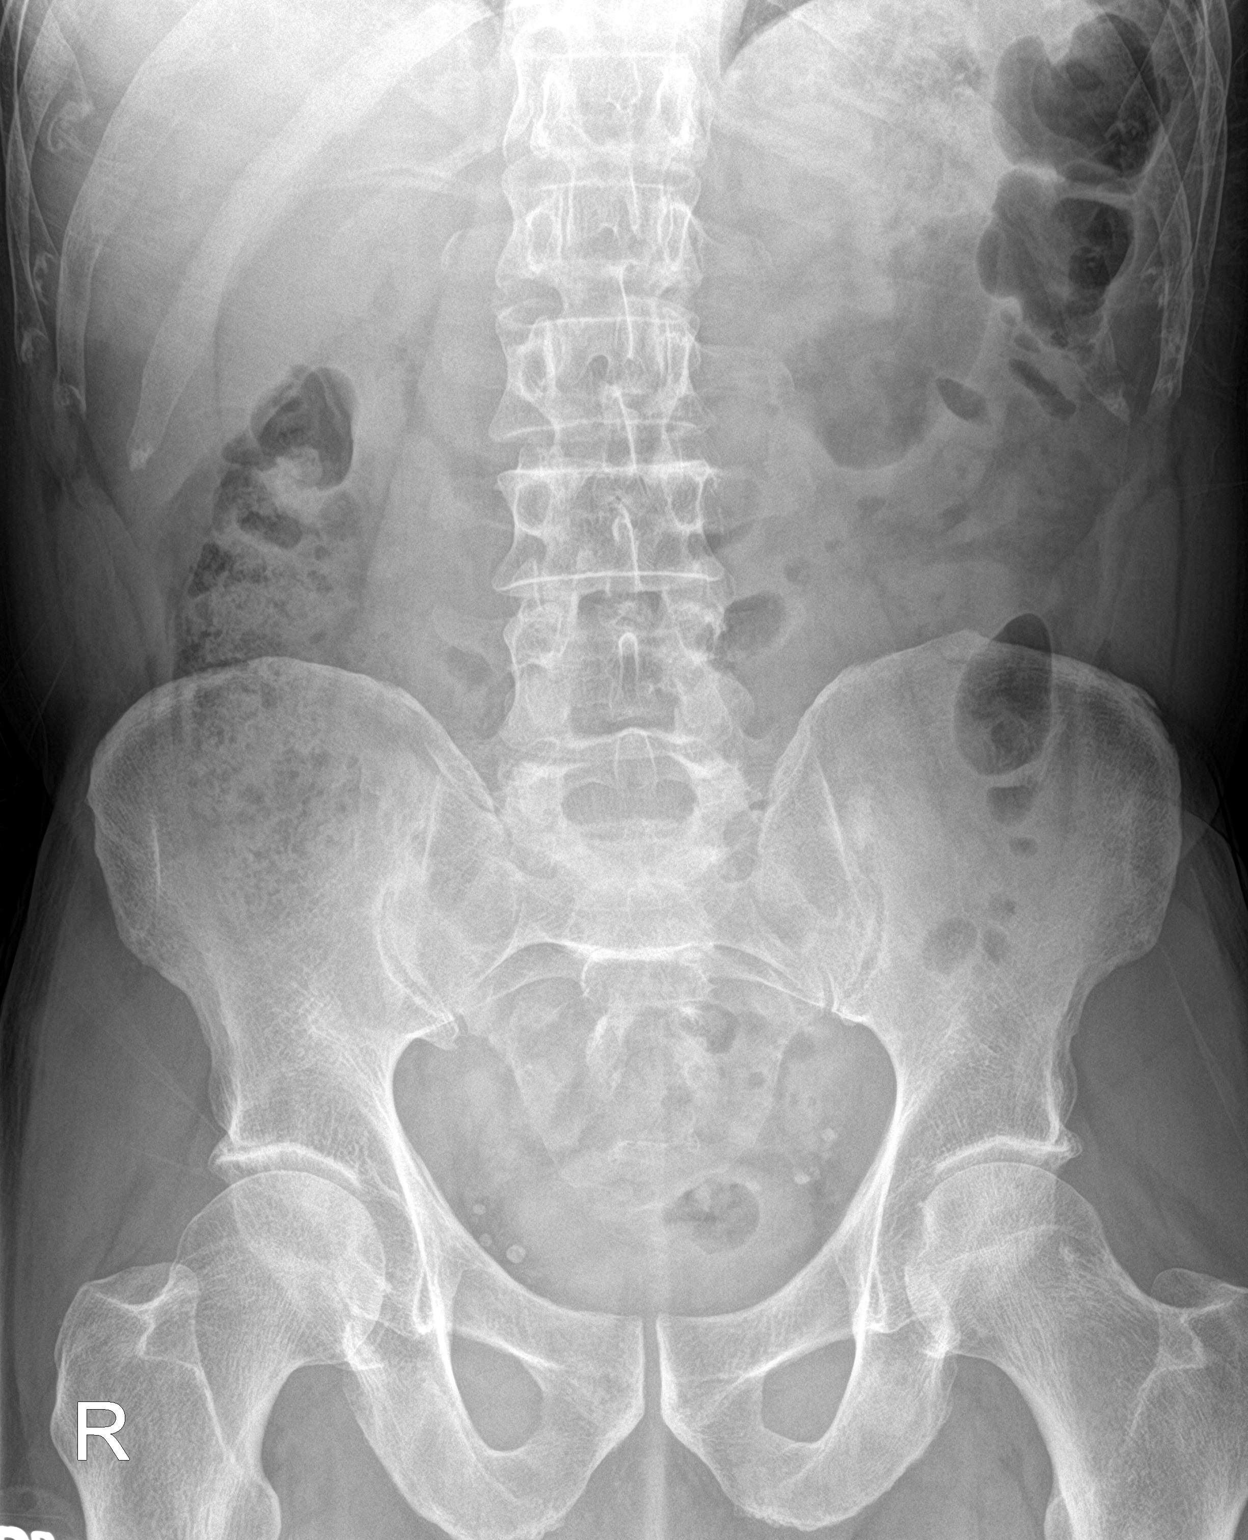

[1 of 1 positions shown; findings below may reference images not displayed]

FINDINGS: There is a new rounded calcification in the left hemipelvis
measuring 4 mm. Other phleboliths appear stable. There are no
suspicious calcifications overlying the kidneys.

Bowel-gas pattern is normal. Osseous structures are within normal
limits.
IMPRESSION: 1. There is a new 4 mm calculus in the left pelvis which may
represent distal left ureteral calculus (migrated distally) versus
bladder calcification. No obvious free

## 2022-10-26 IMAGING — US US RENAL
1 series · 13 of 25 positions shown · non-contrast
Comparison: Previous studies including the CT done on 09/05/2021.

CLINICAL DATA: Ureteral stone

EXAM:
RENAL / URINARY TRACT ULTRASOUND COMPLETE

[Series 1: us renal · 13 of 46 slices shown]
[im 1/46]
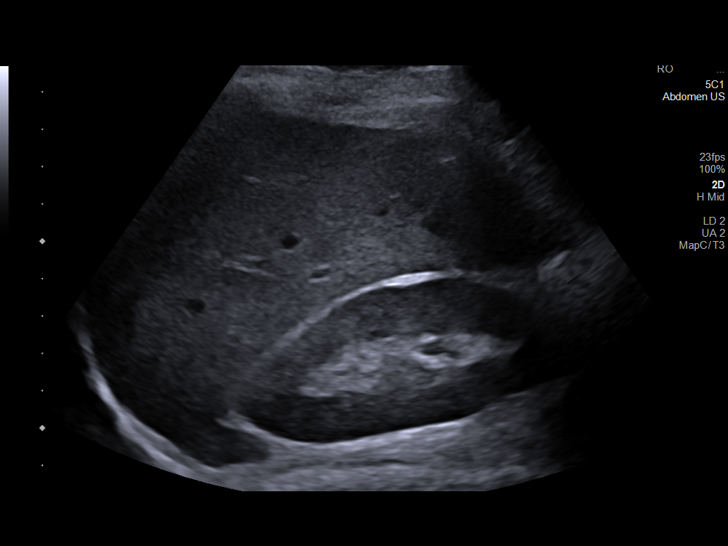
[im 4/46]
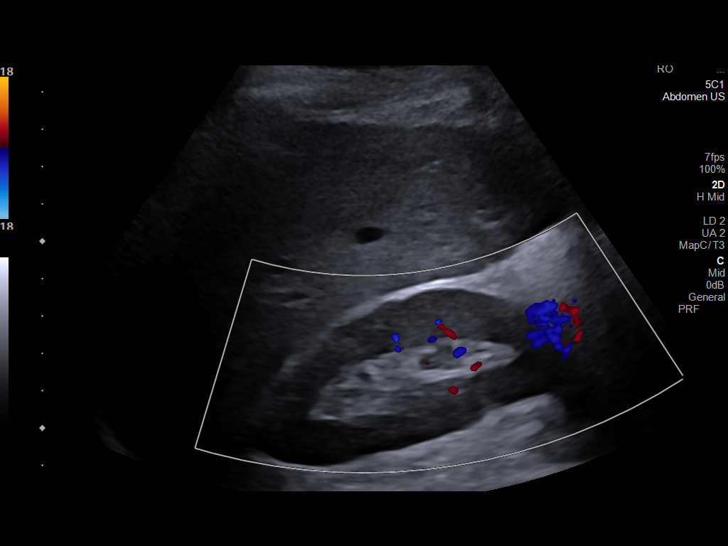
[im 8/46]
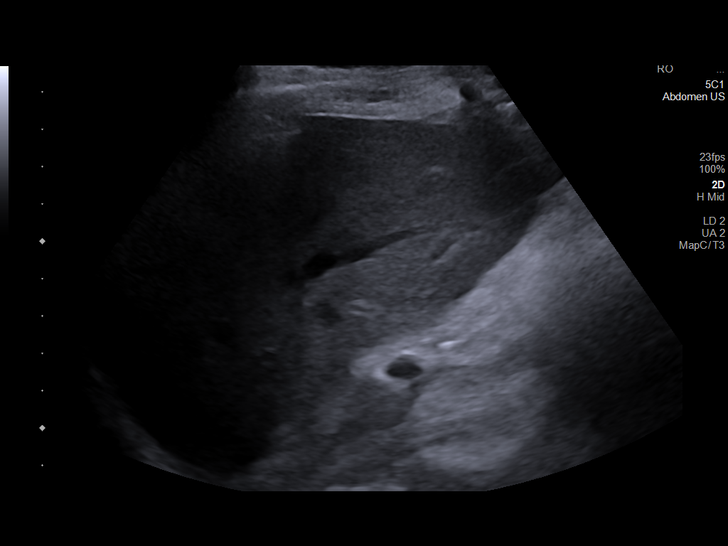
[im 12/46]
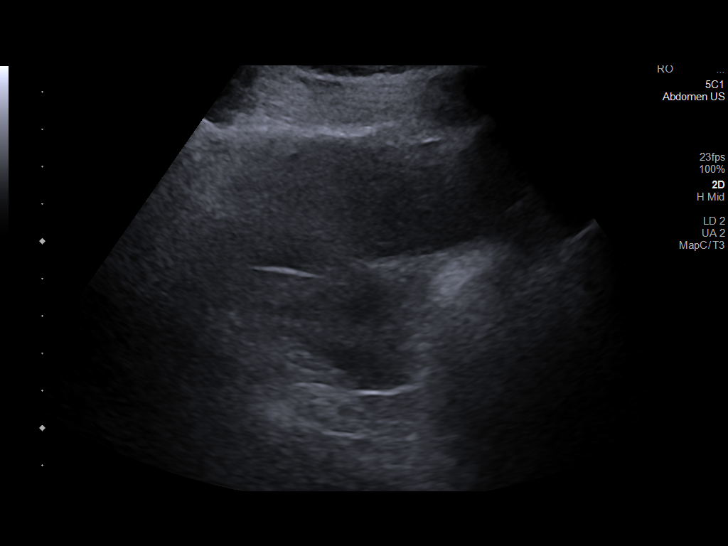
[im 16/46]
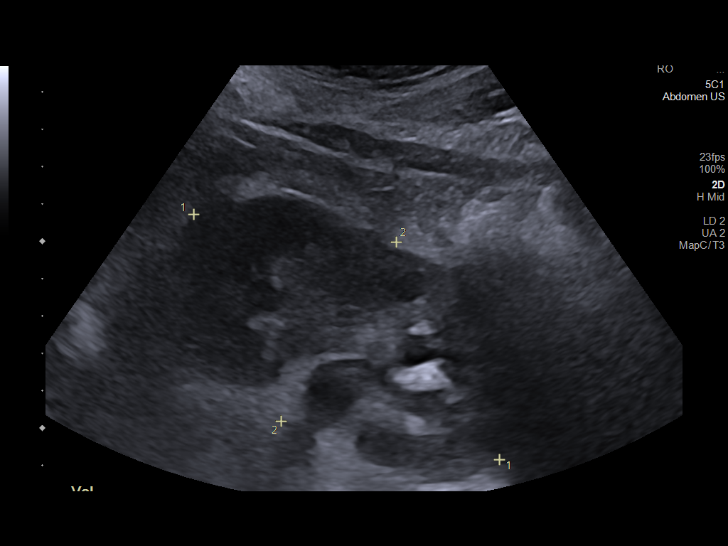
[im 19/46]
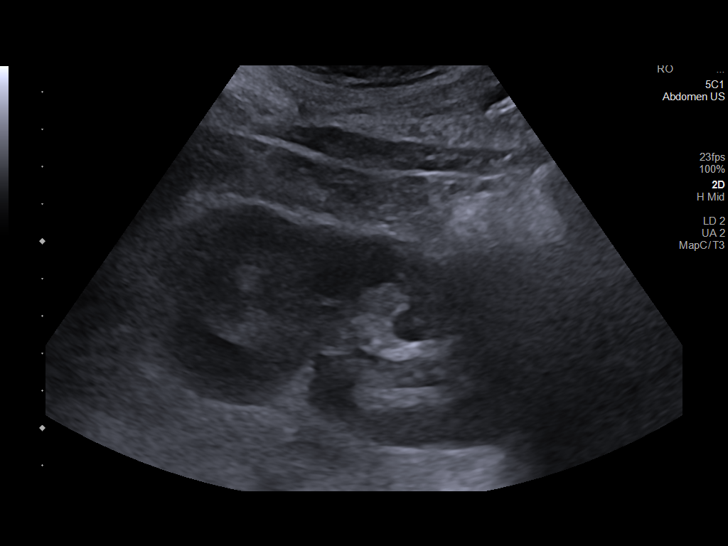
[im 23/46]
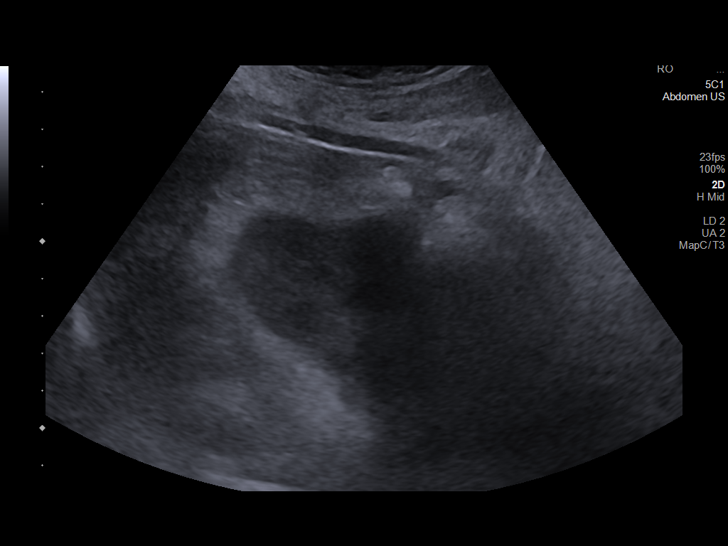
[im 27/46]
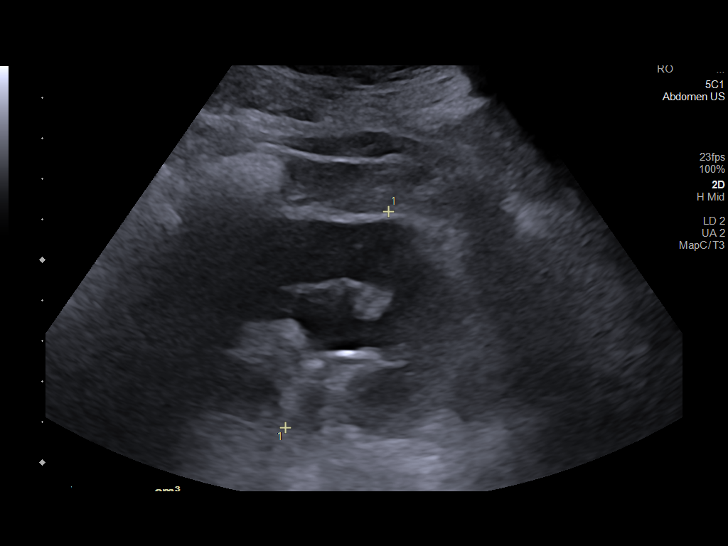
[im 31/46]
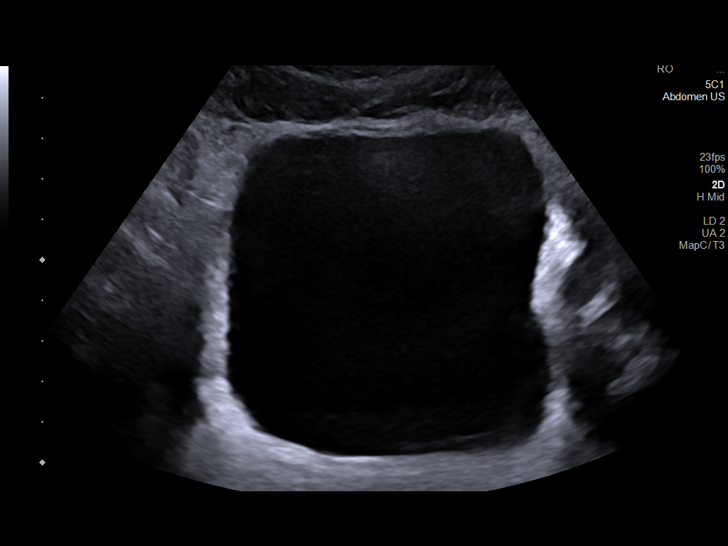
[im 34/46]
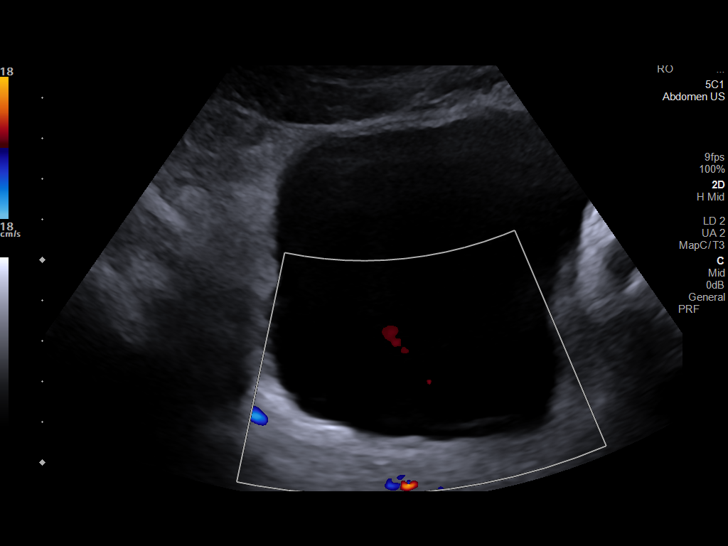
[im 38/46]
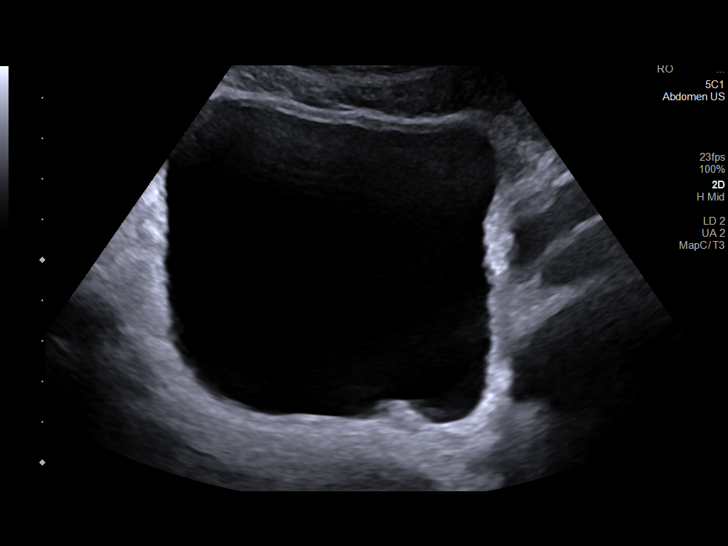
[im 42/46]
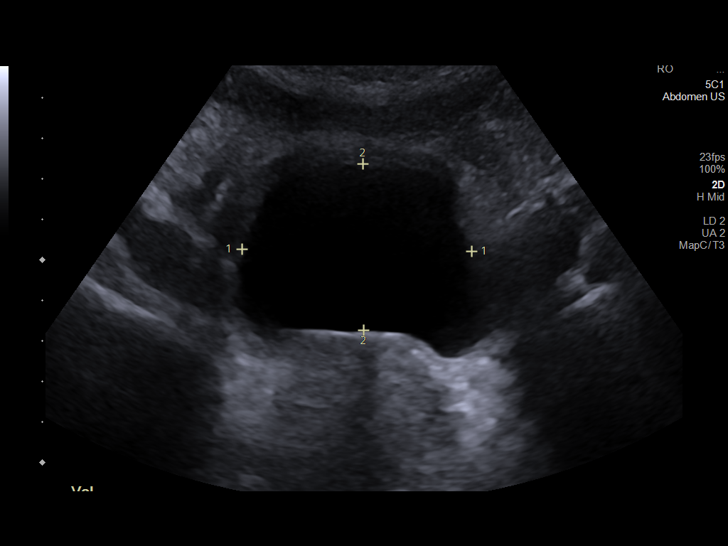
[im 46/46]
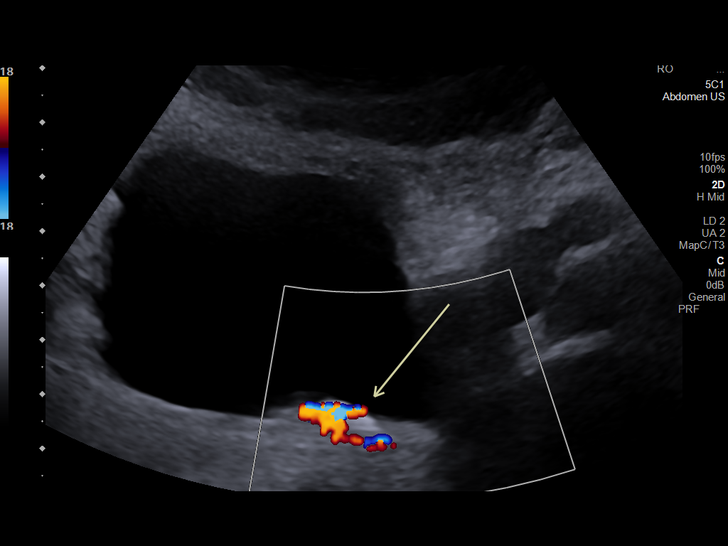

[13 of 25 positions shown; findings below may reference images not displayed]

FINDINGS: Right Kidney:

Renal measurements: 9 x 3.8 x 4.7 cm = volume: 83.6 mL. Echogenicity
within normal limits. No mass or hydronephrosis visualized.

Left Kidney:

Renal measurements: 10.5 x 5.7 x 5.9 cm. There is prominence of left
renal pelvis without significant dilation of minor calices.

Bladder:

There is 2.1 x 0.8 cm lobulation in the posterior margin of urinary
bladder with demonstrable internal vascularity. No definite
enhancing lesions were seen in the posterior wall of the urinary
bladder in the previous CT done on 09/05/2021. Prostate appears to
be projecting into the base of the urinary bladder. There is 7 mm
hyperechoic focus in the dependent portion of bladder lumen without
demonstrable intraluminal mobility.

Other:

None.
IMPRESSION: There is prominence of left renal pelvis without dilation of minor
calices. There is interval resolution of moderate left
hydronephrosis. There is no hydronephrosis in the right kidney.

There is 7 mm hyperechoic focus in the dependent portion of bladder
lumen which may be bladder calculus. There is no definite
demonstrable intraluminal mobility of this structure.

There is 2.1 x 0.8 cm focal bulge in the posterior margin of the
urinary bladder which may suggest projection of enlarged prostate
into the bladder. Less likely possibility would be neoplastic
process in the bladder wall.

## 2022-10-29 IMAGING — DX DG ABDOMEN 1V
1 series · 1 of 1 positions shown · non-contrast
Comparison: 09/24/2021, CT 09/05/2021, 09/10/2021, 09/17/2021

CLINICAL DATA: History of left-sided stone recent lithotripsy

EXAM:
ABDOMEN - 1 VIEW

[abdomen kub]
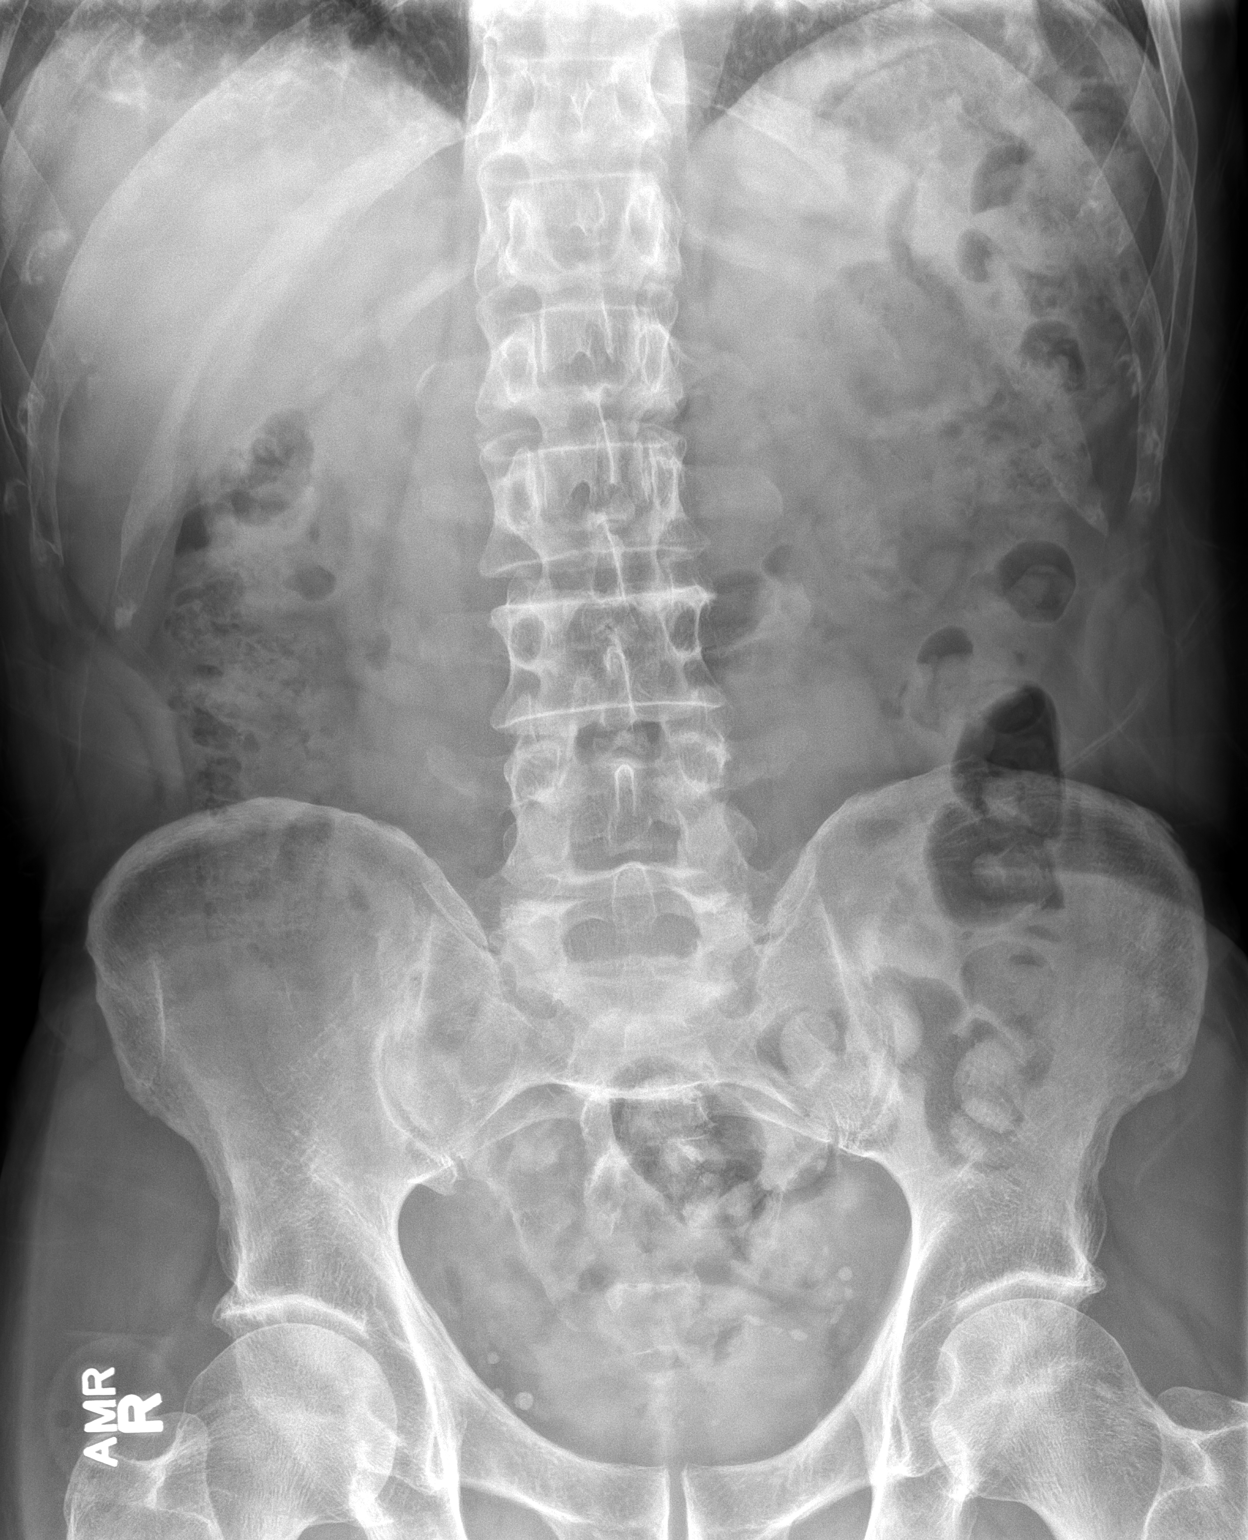

[1 of 1 positions shown; findings below may reference images not displayed]

FINDINGS: Nonobstructed gas pattern. Stable right pelvic phleboliths. There
are multiple small calcifications in the left pelvis which could
reflect multiple small distal ureteral and or bladder stones.
IMPRESSION: Multiple left pelvic calcifications could reflect multiple small
distal ureteral stones or bladder stones.

## 2022-12-03 IMAGING — DX DG ABDOMEN 1V
2 series · 2 of 2 positions shown · non-contrast
Comparison: 11/01/2021.  Older studies.  CT, 09/05/2021.

CLINICAL DATA: Left ureteral calculus.  Recent lithotripsy.

EXAM:
ABDOMEN - 1 VIEW

[abdomen kub (1 of 2)]
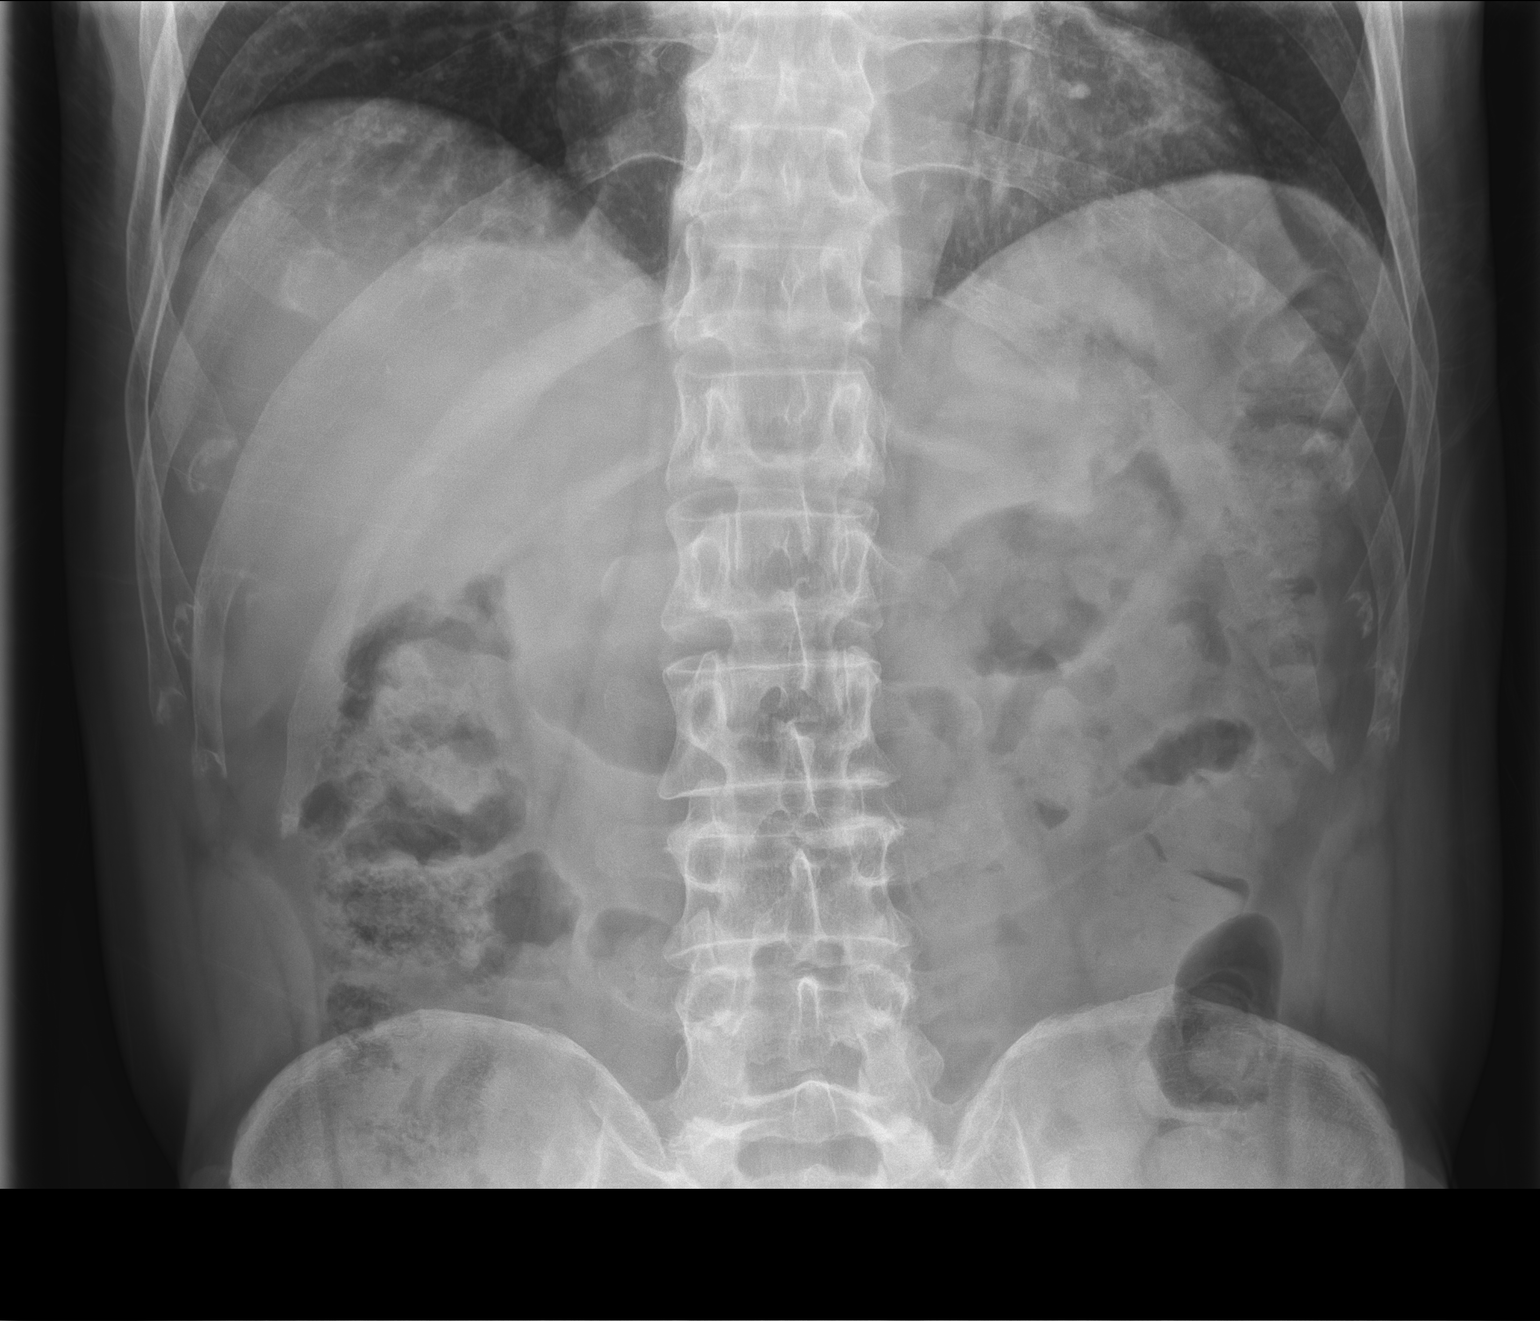

[abdomen kub (2 of 2)]
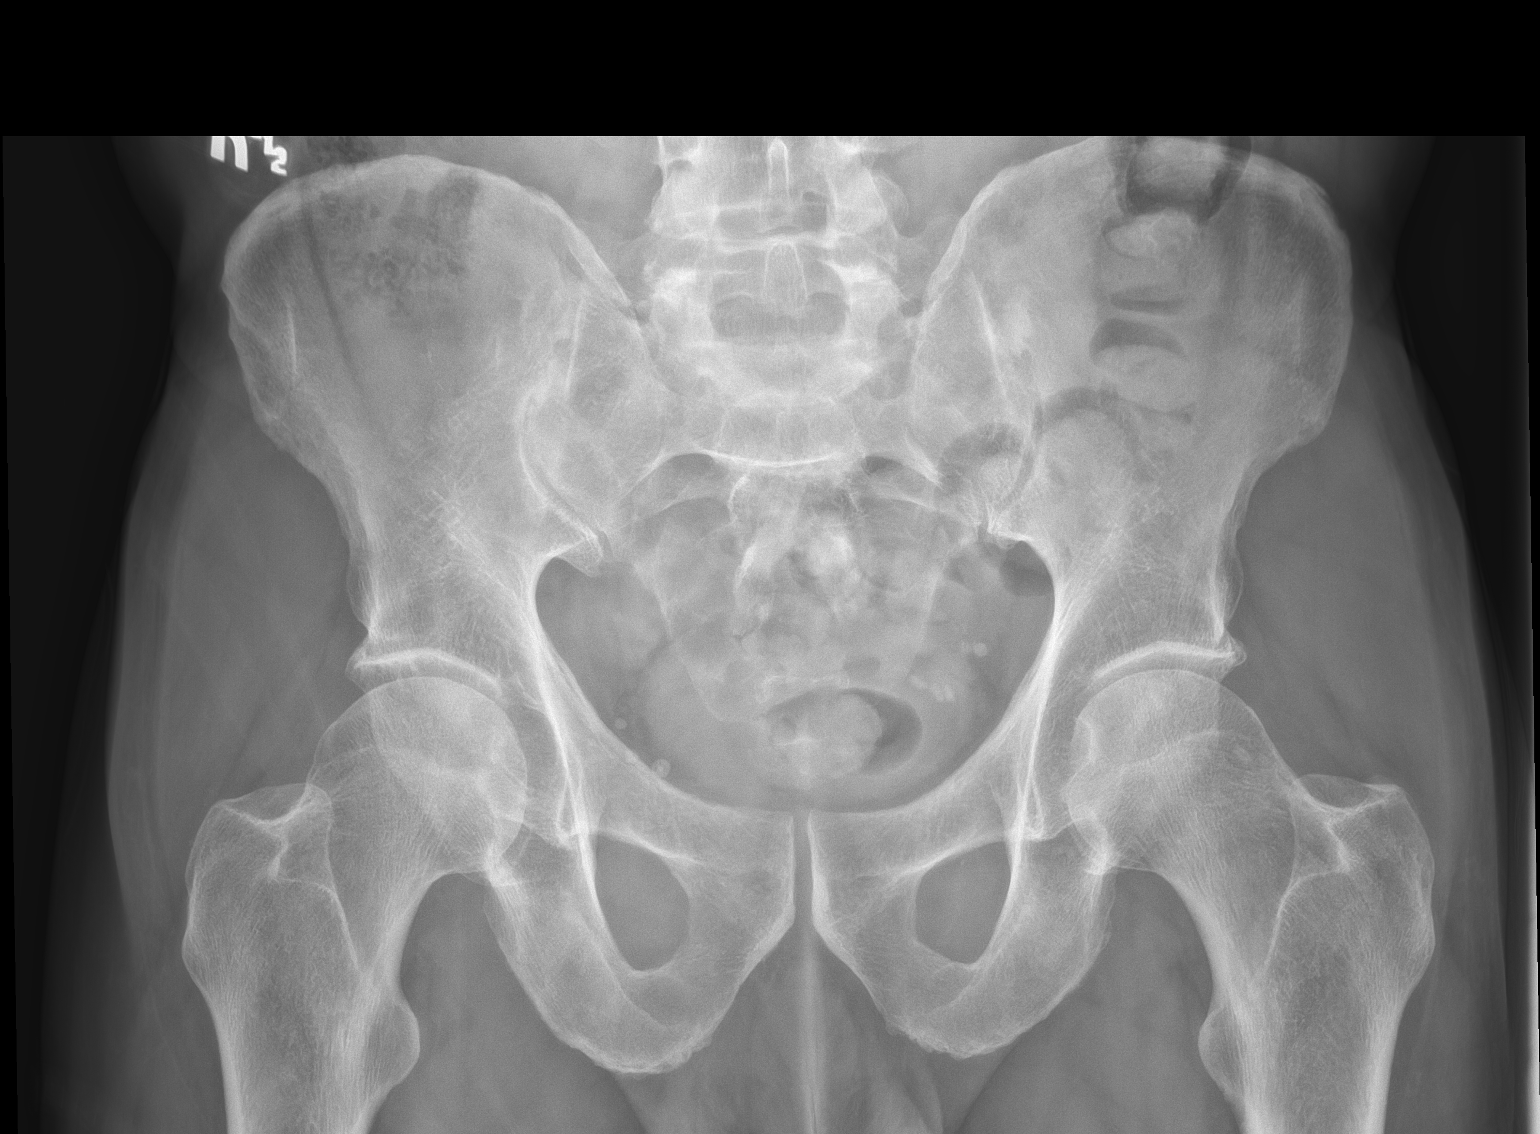

[2 of 2 positions shown; findings below may reference images not displayed]

FINDINGS: No visualized renal or ureteral stone. The distal ureteral stone
noted on the prior CT and radiographs dated 09/10/2021 is no longer
visualized. There are several stable phleboliths in the pelvis.

Normal bowel gas pattern. Soft tissues and skeletal structures are
unremarkable.
IMPRESSION: 1. No visualized renal or ureteral stone.

## 2023-06-14 ENCOUNTER — Inpatient Hospital Stay (HOSPITAL_COMMUNITY)
Admission: EM | Admit: 2023-06-14 | Discharge: 2023-06-17 | DRG: 690 | Disposition: A | Discharge status: Home / Self Care | Payer: Medicare PPO | Attending: Internal Medicine | Admitting: Internal Medicine

## 2023-06-14 ENCOUNTER — Encounter (HOSPITAL_COMMUNITY): Payer: Self-pay

## 2023-06-14 DIAGNOSIS — A419 Sepsis, unspecified organism: Secondary | ICD-10-CM

## 2023-06-14 DIAGNOSIS — Z791 Long term (current) use of non-steroidal anti-inflammatories (NSAID): Secondary | ICD-10-CM

## 2023-06-14 DIAGNOSIS — Z79899 Other long term (current) drug therapy: Secondary | ICD-10-CM

## 2023-06-14 DIAGNOSIS — R7303 Prediabetes: Secondary | ICD-10-CM | POA: Diagnosis present

## 2023-06-14 DIAGNOSIS — R7881 Bacteremia: Secondary | ICD-10-CM | POA: Diagnosis not present

## 2023-06-14 DIAGNOSIS — N201 Calculus of ureter: Secondary | ICD-10-CM | POA: Diagnosis not present

## 2023-06-14 DIAGNOSIS — N3090 Cystitis, unspecified without hematuria: Principal | ICD-10-CM | POA: Diagnosis present

## 2023-06-14 DIAGNOSIS — Z1152 Encounter for screening for COVID-19: Secondary | ICD-10-CM

## 2023-06-14 DIAGNOSIS — B9620 Unspecified Escherichia coli [E. coli] as the cause of diseases classified elsewhere: Secondary | ICD-10-CM | POA: Diagnosis present

## 2023-06-14 DIAGNOSIS — N390 Urinary tract infection, site not specified: Secondary | ICD-10-CM | POA: Diagnosis present

## 2023-06-14 DIAGNOSIS — N3000 Acute cystitis without hematuria: Secondary | ICD-10-CM | POA: Diagnosis not present

## 2023-06-14 DIAGNOSIS — R6510 Systemic inflammatory response syndrome (SIRS) of non-infectious origin without acute organ dysfunction: Secondary | ICD-10-CM | POA: Diagnosis not present

## 2023-06-14 LAB — BASIC METABOLIC PANEL
Anion gap: 10 (ref 5–15)
BUN: 21 mg/dL (ref 8–23)
CO2: 24 mmol/L (ref 22–32)
Calcium: 9.1 mg/dL (ref 8.9–10.3)
Chloride: 103 mmol/L (ref 98–111)
Creatinine, Ser: 1.21 mg/dL (ref 0.61–1.24)
GFR, Estimated: >60 mL/min (ref >60)
Glucose, Bld: 136 mg/dL — ABNORMAL HIGH (ref 70–99)
Potassium: 4.2 mmol/L (ref 3.5–5.1)
Sodium: 137 mmol/L (ref 135–145)

## 2023-06-14 LAB — CBC WITH DIFFERENTIAL/PLATELET
Abs Immature Granulocytes: 0.16 10*3/uL — ABNORMAL HIGH (ref 0.00–0.07)
Basophils Absolute: 0 10*3/uL (ref 0.0–0.1)
Basophils Relative: 0 %
Eosinophils Absolute: 0.1 10*3/uL (ref 0.0–0.5)
Eosinophils Relative: 1 %
HCT: 40.3 % (ref 39.0–52.0)
Hemoglobin: 12.7 g/dL — ABNORMAL LOW (ref 13.0–17.0)
Immature Granulocytes: 1 %
Lymphocytes Relative: 3 %
Lymphs Abs: 0.6 10*3/uL — ABNORMAL LOW (ref 0.7–4.0)
MCH: 24.8 pg — ABNORMAL LOW (ref 26.0–34.0)
MCHC: 31.5 g/dL (ref 30.0–36.0)
MCV: 78.7 fL — ABNORMAL LOW (ref 80.0–100.0)
Monocytes Absolute: 1.2 10*3/uL — ABNORMAL HIGH (ref 0.1–1.0)
Monocytes Relative: 6 %
Neutro Abs: 16.8 10*3/uL — ABNORMAL HIGH (ref 1.7–7.7)
Neutrophils Relative %: 89 %
Platelets: 209 10*3/uL (ref 150–400)
RBC: 5.12 MIL/uL (ref 4.22–5.81)
RDW: 14.4 % (ref 11.5–15.5)
WBC: 18.9 10*3/uL — ABNORMAL HIGH (ref 4.0–10.5)
nRBC: 0 % (ref 0.0–0.2)

## 2023-06-14 LAB — LACTIC ACID, PLASMA: Lactic Acid, Venous: 1.5 mmol/L (ref 0.5–1.9)

## 2023-06-14 LAB — URINALYSIS, ROUTINE W REFLEX MICROSCOPIC
Bilirubin Urine: NEGATIVE
Glucose, UA: NEGATIVE mg/dL
Ketones, ur: 20 mg/dL — AB
Nitrite: NEGATIVE
Protein, ur: 100 mg/dL — AB
RBC / HPF: >50 RBC/hpf (ref 0–5)
Specific Gravity, Urine: 1.021 (ref 1.005–1.030)
WBC, UA: >50 WBC/hpf (ref 0–5)
pH: 5 (ref 5.0–8.0)

## 2023-06-14 LAB — APTT: aPTT: 30 seconds (ref 24–36)

## 2023-06-14 LAB — RESP PANEL BY RT-PCR (RSV, FLU A&B, COVID)  RVPGX2
Influenza A by PCR: NEGATIVE
Influenza B by PCR: NEGATIVE
Resp Syncytial Virus by PCR: NEGATIVE
SARS Coronavirus 2 by RT PCR: NEGATIVE

## 2023-06-14 LAB — PROTIME-INR
INR: 1.3 — ABNORMAL HIGH (ref 0.8–1.2)
Prothrombin Time: 16.1 seconds — ABNORMAL HIGH (ref 11.4–15.2)

## 2023-06-14 MED ORDER — SODIUM CHLORIDE 0.9 % IV SOLN
1.0000 g | Freq: Once | INTRAVENOUS | Status: AC
  Administered 2023-06-14: 1 g via INTRAVENOUS
  Filled 2023-06-14: qty 10

## 2023-06-14 MED ORDER — LACTATED RINGERS IV BOLUS
1000.0000 mL | Freq: Once | INTRAVENOUS | Status: AC
  Administered 2023-06-14: 1000 mL via INTRAVENOUS

## 2023-06-14 NOTE — Sepsis Progress Note (Signed)
Elink following for Sepsis Protocol 

## 2023-06-14 NOTE — ED Provider Notes (Signed)
[place] EMERGENCY DEPARTMENT AT [facility] Provider Note   CSN: [id number] Arrival date & time: 06/14/23  1629     History  Chief Complaint  Patient presents with  . Urinary Frequency    Oscar Morse is a 72 y.o. male with PMH as listed below who states he has had urinary urgency and frequency since last night.  He was up all night urinating.  Associated with dysuria.  Denies hematuria.  Denies flank or abdominal pain.  No fevers chills, nausea vomiting.  He went to urgent care this morning who sent him to the emergency department for concern for sepsis.    Past Medical History:  Diagnosis Date  . Anemia        Home Medications Prior to Admission medications   Medication Sig Start Date End Date Taking? Authorizing Provider  ferrous sulfate 325 (65 FE) MG tablet Take 325 mg by mouth daily with breakfast.    [provider]  meclizine (ANTIVERT) 25 MG tablet Take 25 mg by mouth 3 (three) times daily as needed. 01/03/22   [provider]  Multiple Vitamin (MULTIVITAMIN WITH MINERALS) TABS tablet Take 1 tablet by mouth daily.    [provider]  naproxen (NAPROSYN) 500 MG tablet Take 1 tablet (500 mg total) by mouth 2 (two) times daily. 09/24/21   Stoneking, Danford Bad., MD  oxyCODONE (ROXICODONE) 5 MG immediate release tablet Take 1 tablet (5 mg total) by mouth every 4 (four) hours as needed for severe pain. 09/05/21   Kommor, Madison, MD  tamsulosin (FLOMAX) 0.4 MG CAPS capsule Take 1 capsule (0.4 mg total) by mouth daily. 09/24/21   Stoneking, Danford Bad., MD      Allergies    Patient has no known allergies.    Review of Systems   Review of Systems A 10 point review of systems was performed and is negative unless otherwise reported in HPI.  Physical Exam Updated Vital Signs BP 129/78   Pulse (!) 108   Temp 97.8 F (36.6 C) (Oral)   Resp 18   Ht 5\' 7"  (1.702 m)   Wt 59 kg   SpO2 96%   BMI 20.36 kg/m  Physical Exam General:  Normal appearing male, lying in bed.  HEENT: Sclera anicteric, MMM, trachea midline.  Cardiology: regular tachycardic rate, no murmurs/rubs/gallops.  Resp: Normal respiratory rate and effort.  Abd: Soft, non-tender, non-distended. No rebound tenderness or guarding.  GU: Deferred. MSK: No peripheral edema or signs of trauma.  Skin: warm, dry.  Back: No CVA tenderness Neuro: A&Ox4, CNs II-XII grossly intact. MAEs. Sensation grossly intact.  Psych: Normal mood and affect.   ED Results / Procedures / Treatments   Labs (all labs ordered are listed, but only abnormal results are displayed) Labs Reviewed  URINALYSIS, ROUTINE W REFLEX MICROSCOPIC - Abnormal; Notable for the following components:      Result Value   Color, Urine AMBER (*)    APPearance CLOUDY (*)    Hgb urine dipstick LARGE (*)    Ketones, ur 20 (*)    Protein, ur 100 (*)    Leukocytes,Ua LARGE (*)    Bacteria, UA FEW (*)    All other components within normal limits  BASIC METABOLIC PANEL - Abnormal; Notable for the following components:   Glucose, Bld 136 (*)    All other components within normal limits  CBC WITH DIFFERENTIAL/PLATELET - Abnormal; Notable for the following components:   WBC 18.9 (*)    Hemoglobin  12.7 (*)    MCV 78.7 (*)    MCH 24.8 (*)    Neutro Abs 16.8 (*)    Lymphs Abs 0.6 (*)    Monocytes Absolute 1.2 (*)    Abs Immature Granulocytes 0.16 (*)    All other components within normal limits  CULTURE, BLOOD (ROUTINE X 2)  CULTURE, BLOOD (ROUTINE X 2)  URINE CULTURE  RESP PANEL BY RT-PCR (RSV, FLU A&B, COVID)  RVPGX2  LACTIC ACID, PLASMA  PROTIME-INR  APTT    EKG None  Radiology No results found.  Procedures .Critical Care  Performed by: Loetta Rough, MD Authorized by: Loetta Rough, MD   Critical care provider statement:    Critical care time (minutes):  30   Critical care was necessary to treat or prevent imminent or life-threatening deterioration of the following conditions:   Sepsis   Critical care was time spent personally by me on the following activities:  Development of treatment plan with patient or surrogate, discussions with consultants, evaluation of patient's response to treatment, examination of patient, ordering and review of laboratory studies, ordering and review of radiographic studies, ordering and performing treatments and interventions, pulse oximetry, re-evaluation of patient's condition, review of old charts and obtaining history from patient or surrogate   Care discussed with: admitting provider       Medications Ordered in ED Medications  lactated ringers bolus 1,000 mL (1,000 mLs Intravenous New Bag/Given 06/14/23 1959)  cefTRIAXone (ROCEPHIN) 1 g in sodium chloride 0.9 % 100 mL IVPB (0 g Intravenous Stopped 06/14/23 1959)    ED Course/ Medical Decision Making/ A&P                          Medical Decision Making Amount and/or Complexity of Data Reviewed Labs: ordered. Decision-making details documented in ED Course. ECG/medicine tests: ordered.  Risk Decision regarding hospitalization.    This patient presents to the ED for concern of urinary sxs, this involves an extensive number of treatment options, and is a complaint that carries with it a high risk of complications and morbidity.  I considered the following differential and admission for this acute, potentially life threatening condition.   MDM:    Pt w/ c/f UTI w/ urinary sxs, no hematuria. No flank or abdominal pain to suggest urolithiasis or other intraabdominal infection such as appendicitis/diverticulitis. Patient tachycardic on arrival to 108 and afebrile. +Leukocytosis to 18.9. Given 1L LR and ceftraixone IV for +UTI on UA, blood cultures and urine culture drawn, giving IVF.   Clinical Course as of 06/14/23 2212  Wynelle Link Jun 14, 2023  1906 Creatinine: 1.21 C/w priors [HN]  1949 WBC(!): 18.9 +Leukocytosis [HN]  1949 Lactic Acid, Venous: 1.5 wnl [HN]  2211 Consulted  hospitalist hours ago but had not heard back. Replacing consult. [HN]    Clinical Course User Index [HN] Loetta Rough, MD    Labs: I Ordered, and personally interpreted labs.  The pertinent results include:  those listed above  Additional history obtained from chart review, sister at beside  Cardiac Monitoring: .The patient was maintained on a cardiac monitor.  I personally viewed and interpreted the cardiac monitored which showed an underlying rhythm of: sinus tachycardia  Social Determinants of Health: .Lives independently  Disposition:  Admitted to hospitalist for c/f urosepsis, on IV ceftriaxone, blood/urine cultures pending  Co morbidities that complicate the patient evaluation . Past Medical History:  Diagnosis Date  . Anemia  Medicines Meds ordered this encounter  Medications  . lactated ringers bolus 1,000 mL  . cefTRIAXone (ROCEPHIN) 1 g in sodium chloride 0.9 % 100 mL IVPB    Order Specific Question:   Antibiotic Indication:    Answer:   UTI    I have reviewed the patients home medicines and have made adjustments as needed  Problem List / ED Course: Problem List Items Addressed This Visit   None Visit Diagnoses     Cystitis    -  Primary   Sepsis without acute organ dysfunction, due to unspecified organism [facility])                       This note was created using dictation software, which may contain spelling or grammatical errors.    Loetta Rough, MD 06/14/23 2004

## 2023-06-14 NOTE — ED Triage Notes (Signed)
Pt stated Oscar Morse, his PCP, sent him here due to abnormal labs. BUN is elevated at 25. No hx of kidney issues. Pt is also complaining of urinary frequency and painful urination.

## 2023-06-15 ENCOUNTER — Encounter (HOSPITAL_COMMUNITY): Payer: Self-pay | Admitting: Family Medicine

## 2023-06-15 DIAGNOSIS — N201 Calculus of ureter: Secondary | ICD-10-CM

## 2023-06-15 DIAGNOSIS — R6510 Systemic inflammatory response syndrome (SIRS) of non-infectious origin without acute organ dysfunction: Secondary | ICD-10-CM | POA: Diagnosis not present

## 2023-06-15 DIAGNOSIS — R7881 Bacteremia: Secondary | ICD-10-CM | POA: Diagnosis present

## 2023-06-15 DIAGNOSIS — N3000 Acute cystitis without hematuria: Secondary | ICD-10-CM

## 2023-06-15 DIAGNOSIS — N390 Urinary tract infection, site not specified: Secondary | ICD-10-CM | POA: Diagnosis present

## 2023-06-15 LAB — BLOOD CULTURE ID PANEL (REFLEXED) - BCID2

## 2023-06-15 LAB — CBC WITH DIFFERENTIAL/PLATELET
Abs Immature Granulocytes: 0.3 10*3/uL — ABNORMAL HIGH (ref 0.00–0.07)
Basophils Absolute: 0 10*3/uL (ref 0.0–0.1)
Basophils Relative: 0 %
Eosinophils Absolute: 0 10*3/uL (ref 0.0–0.5)
Eosinophils Relative: 0 %
HCT: 35.7 % — ABNORMAL LOW (ref 39.0–52.0)
Hemoglobin: 11.2 g/dL — ABNORMAL LOW (ref 13.0–17.0)
Lymphocytes Relative: 4 %
Lymphs Abs: 0.6 10*3/uL — ABNORMAL LOW (ref 0.7–4.0)
MCH: 24.7 pg — ABNORMAL LOW (ref 26.0–34.0)
MCHC: 31.4 g/dL (ref 30.0–36.0)
MCV: 78.8 fL — ABNORMAL LOW (ref 80.0–100.0)
Metamyelocytes Relative: 2 %
Monocytes Absolute: 0.3 10*3/uL (ref 0.1–1.0)
Monocytes Relative: 2 %
Neutro Abs: 14 10*3/uL — ABNORMAL HIGH (ref 1.7–7.7)
Neutrophils Relative %: 92 %
Platelets: 163 10*3/uL (ref 150–400)
RBC: 4.53 MIL/uL (ref 4.22–5.81)
RDW: 14.4 % (ref 11.5–15.5)
WBC: 15.2 10*3/uL — ABNORMAL HIGH (ref 4.0–10.5)
nRBC: 0 % (ref 0.0–0.2)

## 2023-06-15 LAB — GLUCOSE, CAPILLARY
Glucose-Capillary: 111 mg/dL — ABNORMAL HIGH (ref 70–99)
Glucose-Capillary: 113 mg/dL — ABNORMAL HIGH (ref 70–99)
Glucose-Capillary: 139 mg/dL — ABNORMAL HIGH (ref 70–99)

## 2023-06-15 LAB — PROTIME-INR
INR: 1.4 — ABNORMAL HIGH (ref 0.8–1.2)
Prothrombin Time: 17.1 seconds — ABNORMAL HIGH (ref 11.4–15.2)

## 2023-06-15 LAB — COMPREHENSIVE METABOLIC PANEL
ALT: 15 U/L (ref 0–44)
AST: 23 U/L (ref 15–41)
Albumin: 3.4 g/dL — ABNORMAL LOW (ref 3.5–5.0)
Alkaline Phosphatase: 59 U/L (ref 38–126)
Anion gap: 9 (ref 5–15)
BUN: 20 mg/dL (ref 8–23)
CO2: 24 mmol/L (ref 22–32)
Calcium: 8.6 mg/dL — ABNORMAL LOW (ref 8.9–10.3)
Chloride: 104 mmol/L (ref 98–111)
Creatinine, Ser: 1.22 mg/dL (ref 0.61–1.24)
GFR, Estimated: >60 mL/min (ref >60)
Glucose, Bld: 102 mg/dL — ABNORMAL HIGH (ref 70–99)
Potassium: 3.8 mmol/L (ref 3.5–5.1)
Sodium: 137 mmol/L (ref 135–145)
Total Bilirubin: 1 mg/dL (ref 0.3–1.2)
Total Protein: 6.8 g/dL (ref 6.5–8.1)

## 2023-06-15 LAB — CORTISOL-AM, BLOOD: Cortisol - AM: 34.6 ug/dL — ABNORMAL HIGH (ref 6.7–22.6)

## 2023-06-15 LAB — PROCALCITONIN: Procalcitonin: 13.58 ng/mL

## 2023-06-15 LAB — HEMOGLOBIN A1C
Hgb A1c MFr Bld: 6 % — ABNORMAL HIGH (ref 4.8–5.6)
Mean Plasma Glucose: 125.5 mg/dL

## 2023-06-15 LAB — MAGNESIUM: Magnesium: 1.6 mg/dL — ABNORMAL LOW (ref 1.7–2.4)

## 2023-06-15 MED ORDER — TAMSULOSIN HCL 0.4 MG PO CAPS
0.4000 mg | ORAL_CAPSULE | Freq: Every day | ORAL | Status: DC
  Administered 2023-06-15 – 2023-06-17 (×3): 0.4 mg via ORAL
  Filled 2023-06-15 (×3): qty 1

## 2023-06-15 MED ORDER — ONDANSETRON HCL 4 MG PO TABS: 4.0000 mg | ORAL_TABLET | Freq: Four times a day (QID) | ORAL | Status: DC | PRN

## 2023-06-15 MED ORDER — ONDANSETRON HCL 4 MG/2ML IJ SOLN: 4.0000 mg | Freq: Four times a day (QID) | INTRAMUSCULAR | Status: DC | PRN

## 2023-06-15 MED ORDER — OXYCODONE HCL 5 MG PO TABS: 5.0000 mg | ORAL_TABLET | ORAL | Status: DC | PRN

## 2023-06-15 MED ORDER — LACTATED RINGERS IV SOLN
150.0000 mL/h | INTRAVENOUS | Status: AC
  Administered 2023-06-15: 150 mL/h via INTRAVENOUS

## 2023-06-15 MED ORDER — INSULIN ASPART 100 UNIT/ML IJ SOLN: 0.0000 [IU] | Freq: Every day | INTRAMUSCULAR | Status: DC

## 2023-06-15 MED ORDER — HEPARIN SODIUM (PORCINE) 5000 UNIT/ML IJ SOLN
5000.0000 [IU] | Freq: Three times a day (TID) | INTRAMUSCULAR | Status: DC
  Administered 2023-06-15 – 2023-06-17 (×7): 5000 [IU] via SUBCUTANEOUS
  Filled 2023-06-15 (×6): qty 1

## 2023-06-15 MED ORDER — ACETAMINOPHEN 325 MG PO TABS
650.0000 mg | ORAL_TABLET | Freq: Four times a day (QID) | ORAL | Status: DC | PRN
  Administered 2023-06-15: 650 mg via ORAL
  Filled 2023-06-15: qty 2

## 2023-06-15 MED ORDER — INSULIN ASPART 100 UNIT/ML IJ SOLN: 0.0000 [IU] | INTRAMUSCULAR | Status: DC

## 2023-06-15 MED ORDER — SODIUM CHLORIDE 0.9 % IV SOLN
INTRAVENOUS | Status: DC
Start: 2023-06-15 — End: 2023-06-15

## 2023-06-15 MED ORDER — SODIUM CHLORIDE 0.9 % IV SOLN
2.0000 g | INTRAVENOUS | Status: DC
  Administered 2023-06-15 – 2023-06-17 (×3): 2 g via INTRAVENOUS
  Filled 2023-06-15 (×3): qty 20

## 2023-06-15 MED ORDER — ACETAMINOPHEN 650 MG RE SUPP: 650.0000 mg | Freq: Four times a day (QID) | RECTAL | Status: DC | PRN

## 2023-06-15 NOTE — Assessment & Plan Note (Addendum)
UA indicative of UTI Urine culture positive for E. Coli, sensitivities pending; BCID + for E coli, likely urinary source Rocephin started in the ED Continue Rocephin

## 2023-06-15 NOTE — Progress Notes (Signed)
   06/15/23 1300  Assess: MEWS Score  Temp 98.8 F (37.1 C)  BP 111/63  MAP (mmHg) 79  Pulse Rate (!) 116  Resp 19  SpO2 96 %  O2 Device Room Air  Assess: MEWS Score  MEWS Temp 0  MEWS Systolic 0  MEWS Pulse 2  MEWS RR 0  MEWS LOC 0  MEWS Score 2  MEWS Score Color Yellow  Assess: if the MEWS score is Yellow or Red  Were vital signs accurate and taken at a resting state? Yes  Does the patient meet 2 or more of the SIRS criteria? Yes  Does the patient have a confirmed or suspected source of infection? No  MEWS guidelines implemented  Yes, yellow  Treat  MEWS Interventions Considered administering scheduled or prn medications/treatments as ordered  Take Vital Signs  Increase Vital Sign Frequency  Yellow: Q2hr x1, continue Q4hrs until patient remains green for 12hrs  Escalate  MEWS: Escalate Yellow: Discuss with charge nurse and consider notifying provider and/or RRT  Notify: Charge Nurse/RN  Name of Charge Nurse/RN Notified Chales Abrahams, RN  Assess: SIRS CRITERIA  SIRS Temperature  0  SIRS Pulse 1  SIRS Respirations  0  SIRS WBC 1  SIRS Score Sum  2

## 2023-06-15 NOTE — Assessment & Plan Note (Signed)
Patient presented with tachycardia to 102-108, leukocytosis 18.9 Normal lactic acid, no evidence of frank organ dysfunction Urine culture and blood culture with E. Coli, sensitivities pending - BCID + for E coli COVID, flu, RSV negative Continue LR 1 L fluid given in the ED Continue Rocephin

## 2023-06-15 NOTE — Progress Notes (Signed)
   06/15/23 0008  Vitals  Temp (!) 101.3 F (38.5 C)  Temp Source Oral  BP 127/77  MAP (mmHg) 91  BP Location Right Arm  BP Method Automatic  Patient Position (if appropriate) Lying  Pulse Rate (!) 106  Pulse Rate Source Dinamap  Resp 20  Level of Consciousness  Level of Consciousness Alert  MEWS COLOR  MEWS Score Color Yellow  Oxygen Therapy  SpO2 97 %  O2 Device Room Air  Pain Assessment  Pain Scale 0-10  Pain Score 0  PCA/Epidural/Spinal Assessment  Respiratory Pattern Regular;Unlabored  Height and Weight  Height 5\' 7"  (1.702 m)  Weight in (lb) to have BMI = 25 159.3  ECG Monitoring  Telemetry Box Number [id number]  Tele Box Verification Completed by Second Verifier Completed  Glasgow Coma Scale  Eye Opening 4  Best Verbal Response (NON-intubated) 5  Best Motor Response 6  Glasgow Coma Scale Score 15  MEWS Score  MEWS Temp 1  MEWS Systolic 0  MEWS Pulse 1  MEWS RR 0  MEWS LOC 0  MEWS Score 2     Tylenol given for temp 101.3. Temp came down to 99.1.

## 2023-06-15 NOTE — Progress Notes (Signed)
  Progress Note   Patient: Oscar Morse ZOX:[id number] DOB: 1951/03/02 DOA: 06/14/2023     1 DOS: the patient was seen and examined on 06/15/2023   Brief hospital course: 72yo with h/o anemia and ureteral calculus presenting with urinary frequency, dysuria.  Had SIRS criteria without frank sepsis, +UTI.  Started on ceftriaxone.  Procalcitonin 13.58.  BCID + E coli without apparent antibiotic resistance.  Assessment and Plan: * SIRS (systemic inflammatory response syndrome) (HCC) - Tachycardic in 102-108, leukocytosis 18.9 - Lactic acid 1.5 - Urine culture and blood culture pending - COVID, flu, RSV negative - Continue LR - 1 L fluid given in the ED - Continue Rocephin - Continue to monitor  UTI (urinary tract infection) - UA indicative of UTI - Urine culture pending - Rocephin started in the ED - Continue Rocephin   Ureteral calculus, left - History of ureteral calculi - Continue tamsulosin     Consultants: None  Procedures: None  Antibiotics: Ceftriaxone 8/18-  30 Day Unplanned Readmission Risk Score    Flowsheet Row ED to Hosp-Admission (Current) from 06/14/2023 in [facility]  30 Day Unplanned Readmission Risk Score (%) 8.62 Filed at 06/15/2023 0401       This score is the patient's risk of an unplanned readmission within 30 days of being discharged (0 -100%). The score is based on dignosis, age, lab data, medications, orders, and past utilization.   Low:  0-14.9   Medium: 15-21.9   High: 22-29.9   Extreme: 30 and above           Subjective: He reports feeling ok today, maybe some better.  Still with some urinary complaints.  No fever.   Objective: Vitals:   06/15/23 0501 06/15/23 1300  BP: 107/66 111/63  Pulse: (!) 106 (!) 116  Resp: 20 19  Temp: 98.4 F (36.9 C) 98.8 F (37.1 C)  SpO2: 100% 96%    Intake/Output Summary (Last 24 hours) at 06/15/2023 1618 Last data filed at 06/15/2023 1500 Gross per 24 hour  Intake 2548.28  ml  Output --  Net 2548.28 ml   Filed Weights   06/14/23 1759  Weight: 59 kg    Exam:  General:  Appears calm and comfortable and is in NAD Eyes:   EOMI, normal lids, iris ENT:  grossly normal hearing, lips & tongue, mmm Neck:  no LAD, masses or thyromegaly Cardiovascular:  RRR, no m/r/g. No LE edema.  Respiratory:   CTA bilaterally with no wheezes/rales/rhonchi.  Normal respiratory effort. Abdomen:  soft, NT, ND Back:   normal alignment, no CVAT Skin:  no rash or induration seen on limited exam Musculoskeletal:  grossly normal tone BUE/BLE, good ROM, no bony abnormality Psychiatric: blunted mood and affect, speech fluent and appropriate, AOx3 Neurologic:  CN 2-12 grossly intact, moves all extremities in coordinated fashion   Data Reviewed: I have reviewed the patient's lab results since admission.  Pertinent labs for today include:  Mag++ 1.6 WBC 15.2 Hgb 11.2 Influenza/RSV/COVID negative     Family Communication: None present; he listed as his sister as NOK but "relative" Oscar Morse is listed as patient contact and so he was called and left message  Disposition: Status is: Inpatient Remains inpatient appropriate because: IV antibiotics, bacteremia  Planned Discharge Destination: Home    Time spent: 35 minutes  Author: Jonah Blue, MD 06/15/2023 4:18 PM  For on call review [email or web address].

## 2023-06-15 NOTE — H&P (Signed)
History and Physical    Patient: Oscar Morse ZOX:[id number] DOB: [birth year] DOA: 06/14/2023 DOS: the patient was seen and examined on 06/15/2023 PCP: Jonathon Bellows, DO  Patient coming from: Home  Chief Complaint:  Chief Complaint  Patient presents with   Urinary Frequency   HPI: Oscar Morse is a 72 y.o. male with medical history significant of anemia, ureteral calculus, and more presents to the ED with a chief complaint of abnormal labs.  Patient has had urinary frequency and dysuria.  Patient tells me he is sleeping so I can examine him but he does not want to talk at this time. No further history obtained at this time. Review of Systems: unable to review all systems due to the inability of the patient to answer questions. Past Medical History:  Diagnosis Date   Anemia    Past Surgical History:  Procedure Laterality Date   EXTRACORPOREAL SHOCK WAVE LITHOTRIPSY Left 09/17/2021   Procedure: EXTRACORPOREAL SHOCK WAVE LITHOTRIPSY (ESWL);  Surgeon: Milderd Meager., MD;  Location: [facility];  Service: Urology;  Laterality: Left;   Social History:  reports that he has never smoked. He has never used smokeless tobacco. He reports that he does not drink alcohol and does not use drugs.  No Known Allergies  History reviewed. No pertinent family history.  Prior to Admission medications   Medication Sig Start Date End Date Taking? Authorizing Provider  ferrous sulfate 325 (65 FE) MG tablet Take 325 mg by mouth daily with breakfast.    [provider]  meclizine (ANTIVERT) 25 MG tablet Take 25 mg by mouth 3 (three) times daily as needed. 01/03/22   [provider]  Multiple Vitamin (MULTIVITAMIN WITH MINERALS) TABS tablet Take 1 tablet by mouth daily.    [provider]  naproxen (NAPROSYN) 500 MG tablet Take 1 tablet (500 mg total) by mouth 2 (two) times daily. 09/24/21   Stoneking, Danford Bad., MD  oxyCODONE (ROXICODONE) 5 MG immediate release tablet Take 1  tablet (5 mg total) by mouth every 4 (four) hours as needed for severe pain. 09/05/21   Kommor, Madison, MD  tamsulosin (FLOMAX) 0.4 MG CAPS capsule Take 1 capsule (0.4 mg total) by mouth daily. 09/24/21   Milderd Meager., MD    Physical Exam: Vitals:   06/14/23 2245 06/15/23 0008 06/15/23 0200 06/15/23 0501  BP: 119/68 127/77  107/66  Pulse: (!) 113 (!) 106  (!) 106  Resp: (!) 27 20  20   Temp:  (!) 101.3 F (38.5 C) 99.1 F (37.3 C) 98.4 F (36.9 C)  TempSrc:  Oral Oral   SpO2: 95% 97%  100%  Weight:      Height:  5\' 7"  (1.702 m)     1.  General: Patient lying supine in bed,  no acute distress   2. Psychiatric: Easily aroused from sleep, mood and behavior normal for situation, pleasant and cooperative with exam   3. Neurologic: Speech and language are normal, face is symmetric, moves all 4 extremities voluntarily, at baseline without acute deficits on limited exam   4. HEENMT:  Head is atraumatic, normocephalic, pupils reactive to light, neck is supple, trachea is midline, mucous membranes are moist   5. Respiratory : Lungs are clear to auscultation bilaterally without wheezing, rhonchi, rales, no cyanosis, no increase in work of breathing or accessory muscle use   6. Cardiovascular : Heart rate tachycardic, rhythm is regular, no murmurs, rubs or gallops, no peripheral edema, peripheral pulses palpated   7. Gastrointestinal:  Abdomen is soft, nondistended, nontender to palpation bowel sounds active, no masses or organomegaly palpated   8. Skin:  Skin is warm, dry and intact without rashes, acute lesions, or ulcers on limited exam   9.Musculoskeletal:  No acute deformities or trauma, no asymmetry in tone, no peripheral edema, peripheral pulses palpated, no tenderness to palpation in the extremities  Data Reviewed: In the ED Afebrile, tachycardic Leukocytosis 18.9 Chemistry unremarkable  Rocephin started  1 L LR bolus  Blood culture and urine culture  pending   Assessment and Plan: * SIRS (systemic inflammatory response syndrome) (HCC) - Tachycardic in 102-108, leukocytosis 18.9 - Lactic acid 1.5 - Urine culture and blood culture pending - COVID, flu, RSV negative - 1 L fluid given in the ED - Continue Rocephin - Continue to monitor  UTI (urinary tract infection) - UA indicative of UTI - Urine culture pending - Rocephin started in the ED - Continue Rocephin   Ureteral calculus, left - History of ureteral calculi - Continue tamsulosin      Advance Care Planning:   Code Status: Full Code  Consults: None at this time  Family Communication: No family at bedside  Severity of Illness: The appropriate patient status for this patient is INPATIENT. Inpatient status is judged to be reasonable and necessary in order to provide the required intensity of service to ensure the patient's safety. The patient's presenting symptoms, physical exam findings, and initial radiographic and laboratory data in the context of their chronic comorbidities is felt to place them at high risk for further clinical deterioration. Furthermore, it is not anticipated that the patient will be medically stable for discharge from the hospital within 2 midnights of admission.   * I certify that at the point of admission it is my clinical judgment that the patient will require inpatient hospital care spanning beyond 2 midnights from the point of admission due to high intensity of service, high risk for further deterioration and high frequency of surveillance required.*  Author: Lilyan Gilford, DO 06/15/2023 5:37 AM  For on call review [email or web address].

## 2023-06-15 NOTE — Progress Notes (Signed)
Date and time results received: 06/15/23 1002 (use smartphrase ".now" to insert current time)  Test: Blood cultures Critical Value: Gram negative rods  Name of Provider Notified: Jonah Blue, MD

## 2023-06-15 NOTE — Hospital Course (Addendum)
72yo with h/o anemia and ureteral calculus presenting with urinary frequency, dysuria.  Had SIRS criteria without frank sepsis, +UTI.  Started on ceftriaxone.  Procalcitonin 13.58.  BCID + E coli without apparent antibiotic resistance.

## 2023-06-15 NOTE — Progress Notes (Addendum)
PHARMACY - PHYSICIAN COMMUNICATION CRITICAL VALUE ALERT - BLOOD CULTURE IDENTIFICATION (BCID)  Oscar Morse is an 72 y.o. male who presented to [facility] on 06/14/2023 with a chief complaint of increased urinary frequency and painful urination  Assessment:  3/4 bottles (2 anaerobic bottles, 1 aerobic bottle), E. coli (no resistance detected)  Name of physician (or Provider) Contacted: Dr. Ophelia Charter  Current antibiotics: Ceftriaxone 2 g IV q24H  Changes to prescribed antibiotics recommended:  Patient is on recommended antibiotics - No changes needed Provider agreed with continuing ceftriaxone   Results for orders placed or performed during the hospital encounter of 06/14/23  Blood Culture ID Panel (Reflexed) (Collected: 06/14/2023  7:20 PM)  Result Value Ref Range   Enterococcus faecalis NOT DETECTED NOT DETECTED   Enterococcus Faecium NOT DETECTED NOT DETECTED   Listeria monocytogenes NOT DETECTED NOT DETECTED   Staphylococcus species NOT DETECTED NOT DETECTED   Staphylococcus aureus (BCID) NOT DETECTED NOT DETECTED   Staphylococcus epidermidis NOT DETECTED NOT DETECTED   Staphylococcus lugdunensis NOT DETECTED NOT DETECTED   Streptococcus species NOT DETECTED NOT DETECTED   Streptococcus agalactiae NOT DETECTED NOT DETECTED   Streptococcus pneumoniae NOT DETECTED NOT DETECTED   Streptococcus pyogenes NOT DETECTED NOT DETECTED   A.calcoaceticus-baumannii NOT DETECTED NOT DETECTED   Bacteroides fragilis NOT DETECTED NOT DETECTED   Enterobacterales DETECTED (A) NOT DETECTED   Enterobacter cloacae complex NOT DETECTED NOT DETECTED   Escherichia coli DETECTED (A) NOT DETECTED   Klebsiella aerogenes NOT DETECTED NOT DETECTED   Klebsiella oxytoca NOT DETECTED NOT DETECTED   Klebsiella pneumoniae NOT DETECTED NOT DETECTED   Proteus species NOT DETECTED NOT DETECTED   Salmonella species NOT DETECTED NOT DETECTED   Serratia marcescens NOT DETECTED NOT DETECTED   Haemophilus influenzae  NOT DETECTED NOT DETECTED   Neisseria meningitidis NOT DETECTED NOT DETECTED   Pseudomonas aeruginosa NOT DETECTED NOT DETECTED   Stenotrophomonas maltophilia NOT DETECTED NOT DETECTED   Candida albicans NOT DETECTED NOT DETECTED   Candida auris NOT DETECTED NOT DETECTED   Candida glabrata NOT DETECTED NOT DETECTED   Candida krusei NOT DETECTED NOT DETECTED   Candida parapsilosis NOT DETECTED NOT DETECTED   Candida tropicalis NOT DETECTED NOT DETECTED   Cryptococcus neoformans/gattii NOT DETECTED NOT DETECTED   CTX-M ESBL NOT DETECTED NOT DETECTED   Carbapenem resistance IMP NOT DETECTED NOT DETECTED   Carbapenem resistance KPC NOT DETECTED NOT DETECTED   Carbapenem resistance NDM NOT DETECTED NOT DETECTED   Carbapenem resist OXA 48 LIKE NOT DETECTED NOT DETECTED   Carbapenem resistance VIM NOT DETECTED NOT DETECTED    Orson Aloe 06/15/2023  1:33 PM

## 2023-06-15 NOTE — Plan of Care (Signed)
  Problem: Nutritional: Goal: Maintenance of adequate nutrition will improve Outcome: Progressing   

## 2023-06-15 NOTE — Assessment & Plan Note (Deleted)
-   Tachycardic in 102-108, leukocytosis 18.9 - Lactic acid 1.5 - Urine culture and blood culture pending - COVID, flu, RSV negative - Continue LR - 1 L fluid given in the ED - Continue Rocephin - Continue to monitor

## 2023-06-15 NOTE — Progress Notes (Signed)
   06/15/23 1322  TOC Brief Assessment  Insurance and Status Reviewed  Patient has primary care physician Yes  Home environment has been reviewed Home alone  Prior level of function: Independant  Prior/Current Home Services No current home services  Social Determinants of Health Reivew SDOH reviewed no interventions necessary  Readmission risk has been reviewed Yes  Transition of care needs no transition of care needs at this time   Transition of Care Department [facility]) has reviewed patient and no TOC needs have been identified at this time. We will continue to monitor patient advancement through interdisciplinary progression rounds. If new patient transition needs arise, please place a TOC consult.

## 2023-06-15 NOTE — Assessment & Plan Note (Addendum)
History of ureteral calculi Continue tamsulosin

## 2023-06-16 ENCOUNTER — Other Ambulatory Visit: Payer: Self-pay

## 2023-06-16 DIAGNOSIS — B9620 Unspecified Escherichia coli [E. coli] as the cause of diseases classified elsewhere: Secondary | ICD-10-CM | POA: Diagnosis not present

## 2023-06-16 DIAGNOSIS — R7881 Bacteremia: Secondary | ICD-10-CM

## 2023-06-16 DIAGNOSIS — R7303 Prediabetes: Secondary | ICD-10-CM | POA: Diagnosis present

## 2023-06-16 LAB — CBC WITH DIFFERENTIAL/PLATELET
Abs Immature Granulocytes: 0 10*3/uL (ref 0.00–0.07)
Band Neutrophils: 3 %
Basophils Absolute: 0 10*3/uL (ref 0.0–0.1)
Basophils Relative: 0 %
Eosinophils Absolute: 0 10*3/uL (ref 0.0–0.5)
Eosinophils Relative: 0 %
HCT: 28.2 % — ABNORMAL LOW (ref 39.0–52.0)
Hemoglobin: 9 g/dL — ABNORMAL LOW (ref 13.0–17.0)
Lymphocytes Relative: 7 %
Lymphs Abs: 1.1 10*3/uL (ref 0.7–4.0)
MCH: 24.8 pg — ABNORMAL LOW (ref 26.0–34.0)
MCHC: 31.9 g/dL (ref 30.0–36.0)
MCV: 77.7 fL — ABNORMAL LOW (ref 80.0–100.0)
Monocytes Absolute: 0.3 10*3/uL (ref 0.1–1.0)
Monocytes Relative: 2 %
Neutro Abs: 14.8 10*3/uL — ABNORMAL HIGH (ref 1.7–7.7)
Neutrophils Relative %: 88 %
Platelets: 116 10*3/uL — ABNORMAL LOW (ref 150–400)
RBC: 3.63 MIL/uL — ABNORMAL LOW (ref 4.22–5.81)
RDW: 14.6 % (ref 11.5–15.5)
WBC: 16.3 10*3/uL — ABNORMAL HIGH (ref 4.0–10.5)
nRBC: 0 % (ref 0.0–0.2)

## 2023-06-16 LAB — BASIC METABOLIC PANEL
Anion gap: 7 (ref 5–15)
BUN: 21 mg/dL (ref 8–23)
CO2: 24 mmol/L (ref 22–32)
Calcium: 8 mg/dL — ABNORMAL LOW (ref 8.9–10.3)
Chloride: 104 mmol/L (ref 98–111)
Creatinine, Ser: 1.19 mg/dL (ref 0.61–1.24)
GFR, Estimated: >60 mL/min (ref >60)
Glucose, Bld: 100 mg/dL — ABNORMAL HIGH (ref 70–99)
Potassium: 3.3 mmol/L — ABNORMAL LOW (ref 3.5–5.1)
Sodium: 135 mmol/L (ref 135–145)

## 2023-06-16 LAB — GLUCOSE, CAPILLARY
Glucose-Capillary: 107 mg/dL — ABNORMAL HIGH (ref 70–99)
Glucose-Capillary: 112 mg/dL — ABNORMAL HIGH (ref 70–99)
Glucose-Capillary: 115 mg/dL — ABNORMAL HIGH (ref 70–99)
Glucose-Capillary: 97 mg/dL (ref 70–99)
Glucose-Capillary: 97 mg/dL (ref 70–99)

## 2023-06-16 LAB — PROCALCITONIN: Procalcitonin: 19.23 ng/mL

## 2023-06-16 MED ORDER — POTASSIUM CHLORIDE CRYS ER 20 MEQ PO TBCR
40.0000 meq | EXTENDED_RELEASE_TABLET | Freq: Once | ORAL | Status: AC
  Administered 2023-06-16: 40 meq via ORAL
  Filled 2023-06-16: qty 2

## 2023-06-16 NOTE — Plan of Care (Signed)
  Problem: Education: Goal: Ability to describe self-care measures that may prevent or decrease complications (Diabetes Survival Skills Education) will improve Outcome: Adequate for Discharge   Problem: Coping: Goal: Ability to adjust to condition or change in health will improve Outcome: Adequate for Discharge   Problem: Fluid Volume: Goal: Ability to maintain a balanced intake and output will improve Outcome: Adequate for Discharge

## 2023-06-16 NOTE — Assessment & Plan Note (Signed)
Mildly elevated glucose, A1c 6 Does not need treatment now Needs close outpatient monitoring Recommend outpatient diabetes education

## 2023-06-16 NOTE — Progress Notes (Signed)
Progress Note   Patient: Oscar Morse ZOX:[id number] DOB: 07/28/1951 DOA: 06/14/2023     2 DOS: the patient was seen and examined on 06/16/2023   Brief hospital course: 72yo with h/o anemia and ureteral calculus presenting with urinary frequency, dysuria.  Had SIRS criteria without frank sepsis, +UTI.  Started on ceftriaxone.  Procalcitonin 13.58.  BCID + E coli without apparent antibiotic resistance.  Assessment and Plan: * Bacteremia due to Escherichia coli Patient presented with tachycardia to 102-108, leukocytosis 18.9 Normal lactic acid, no evidence of frank organ dysfunction Urine culture and blood culture with E. Coli, sensitivities pending - BCID + for E coli COVID, flu, RSV negative Continue LR 1 L fluid given in the ED Continue Rocephin  Prediabetes Mildly elevated glucose, A1c 6 Does not need treatment now Needs close outpatient monitoring Recommend outpatient diabetes education  UTI (urinary tract infection) UA indicative of UTI Urine culture positive for E. Coli, sensitivities pending; BCID + for E coli, likely urinary source Rocephin started in the ED Continue Rocephin  Ureteral calculus, left History of ureteral calculi Continue tamsulosin   Consultants: None   Procedures: None   Antibiotics: Ceftriaxone 8/18-  30 Day Unplanned Readmission Risk Score    Flowsheet Row ED to Hosp-Admission (Current) from 06/14/2023 in [facility] MEDICAL SURGICAL UNIT  30 Day Unplanned Readmission Risk Score (%) 9.84 Filed at 06/16/2023 0401       This score is the patient's risk of an unplanned readmission within 30 days of being discharged (0 -100%). The score is based on dignosis, age, lab data, medications, orders, and past utilization.   Low:  0-14.9   Medium: 15-21.9   High: 22-29.9   Extreme: 30 and above           Subjective: He is feeling better.  Has been up and walking.  He is not eager to go home, wants to be well treated prior to  dc.   Objective: Vitals:   06/16/23 0437 06/16/23 1240  BP: 108/66 109/68  Pulse: 97 84  Resp: 18 18  Temp: 98.2 F (36.8 C) 98.3 F (36.8 C)  SpO2: 98% 99%    Intake/Output Summary (Last 24 hours) at 06/16/2023 1533 Last data filed at 06/16/2023 1300 Gross per 24 hour  Intake 720 ml  Output --  Net 720 ml   Filed Weights   06/14/23 1759  Weight: 59 kg    Exam:  General:  Appears calm and comfortable and is in NAD Eyes:   EOMI, normal lids, iris ENT:  grossly normal hearing, lips & tongue, mmm Neck:  no LAD, masses or thyromegaly Cardiovascular:  RRR, no m/r/g. No LE edema.  Respiratory:   CTA bilaterally with no wheezes/rales/rhonchi.  Normal respiratory effort. Abdomen:  soft, NT, ND Back:   normal alignment, no CVAT Skin:  no rash or induration seen on limited exam Musculoskeletal:  grossly normal tone BUE/BLE, good ROM, no bony abnormality Psychiatric: blunted mood and affect, speech fluent and appropriate, AOx3 Neurologic:  CN 2-12 grossly intact, moves all extremities in coordinated fashion   Data Reviewed: I have reviewed the patient's lab results since admission.  Pertinent labs for today include:  K+ 3.3 Procalcitonin 19.23 WBC 16.3 Hgb 9 Platelets 116 A1c 6    Family Communication: None present; unable to reach [facility] by telephone  Disposition: Status is: Inpatient Remains inpatient appropriate because: ongoing infection with bacteremia  Planned Discharge Destination: Home    Time spent: 35 minutes  Author: Jonah Blue,  MD 06/16/2023 3:33 PM  For on call review [email or web address].

## 2023-06-17 DIAGNOSIS — B9620 Unspecified Escherichia coli [E. coli] as the cause of diseases classified elsewhere: Secondary | ICD-10-CM | POA: Diagnosis not present

## 2023-06-17 DIAGNOSIS — R7881 Bacteremia: Secondary | ICD-10-CM | POA: Diagnosis not present

## 2023-06-17 LAB — CULTURE, BLOOD (ROUTINE X 2): Special Requests: ADEQUATE

## 2023-06-17 LAB — CBC WITH DIFFERENTIAL/PLATELET
Abs Immature Granulocytes: 0.05 10*3/uL (ref 0.00–0.07)
Basophils Absolute: 0 10*3/uL (ref 0.0–0.1)
Basophils Relative: 0 %
Eosinophils Absolute: 0.1 10*3/uL (ref 0.0–0.5)
Eosinophils Relative: 0 %
HCT: 28.3 % — ABNORMAL LOW (ref 39.0–52.0)
Hemoglobin: 9 g/dL — ABNORMAL LOW (ref 13.0–17.0)
Immature Granulocytes: 0 %
Lymphocytes Relative: 7 %
Lymphs Abs: 1 10*3/uL (ref 0.7–4.0)
MCH: 24.7 pg — ABNORMAL LOW (ref 26.0–34.0)
MCHC: 31.8 g/dL (ref 30.0–36.0)
MCV: 77.7 fL — ABNORMAL LOW (ref 80.0–100.0)
Monocytes Absolute: 0.7 10*3/uL (ref 0.1–1.0)
Monocytes Relative: 5 %
Neutro Abs: 12 10*3/uL — ABNORMAL HIGH (ref 1.7–7.7)
Neutrophils Relative %: 88 %
Platelets: 131 10*3/uL — ABNORMAL LOW (ref 150–400)
RBC: 3.64 MIL/uL — ABNORMAL LOW (ref 4.22–5.81)
RDW: 14.4 % (ref 11.5–15.5)
WBC: 13.9 10*3/uL — ABNORMAL HIGH (ref 4.0–10.5)
nRBC: 0 % (ref 0.0–0.2)

## 2023-06-17 LAB — PROCALCITONIN: Procalcitonin: 13.43 ng/mL

## 2023-06-17 LAB — GLUCOSE, CAPILLARY
Glucose-Capillary: 97 mg/dL (ref 70–99)
Glucose-Capillary: 113 mg/dL — ABNORMAL HIGH (ref 70–99)

## 2023-06-17 LAB — BASIC METABOLIC PANEL
Anion gap: 7 (ref 5–15)
BUN: 22 mg/dL (ref 8–23)
CO2: 23 mmol/L (ref 22–32)
Calcium: 8.3 mg/dL — ABNORMAL LOW (ref 8.9–10.3)
Chloride: 107 mmol/L (ref 98–111)
Creatinine, Ser: 1.04 mg/dL (ref 0.61–1.24)
GFR, Estimated: >60 mL/min (ref >60)
Glucose, Bld: 110 mg/dL — ABNORMAL HIGH (ref 70–99)
Potassium: 3.7 mmol/L (ref 3.5–5.1)
Sodium: 137 mmol/L (ref 135–145)

## 2023-06-17 LAB — URINE CULTURE: Culture: >=100000 — AB

## 2023-06-17 MED ORDER — TAMSULOSIN HCL 0.4 MG PO CAPS: 0.4000 mg | ORAL_CAPSULE | Freq: Every day | ORAL | 1 refills | Status: AC

## 2023-06-17 MED ORDER — CEPHALEXIN 500 MG PO CAPS: 1000.0000 mg | ORAL_CAPSULE | Freq: Four times a day (QID) | ORAL | 0 refills | Status: AC

## 2023-06-17 NOTE — Plan of Care (Signed)

## 2023-06-17 NOTE — Discharge Summary (Signed)
Physician Discharge Summary   Patient: Oscar Morse MRN: [id number] DOB: Oct 06, 1951  Admit date:     06/14/2023  Discharge date: 06/17/23  Discharge Physician: Jonah Blue   PCP: Jonathon Bellows, DO   Recommendations at discharge:   Complete antibiotics (cephalexin twice daily until gone, through 8/28) Restart tamsulosin/Flomax You have pre-diabetes; recommend outpatient diabetes education Follow up with PCP in 1-2 weeks  Discharge Diagnoses: Principal Problem:   Bacteremia due to Escherichia coli Active Problems:   Ureteral calculus, left   UTI (urinary tract infection)   Prediabetes    Hospital Course: 72yo with h/o anemia and ureteral calculus presenting with urinary frequency, dysuria.  Had SIRS criteria without frank sepsis, +UTI.  Started on ceftriaxone.  Procalcitonin 13.58.  BCID + E coli without apparent antibiotic resistance.  Assessment and Plan: * Bacteremia due to Escherichia coli Patient presented with tachycardia to 102-108, leukocytosis 18.9 Normal lactic acid, no evidence of frank organ dysfunction Urine culture and blood culture with E. Coli, pansensitive COVID, flu, RSV negative Treated with Rocephin -> cephalexin  Prediabetes Mildly elevated glucose, A1c 6 Does not need treatment now Needs close outpatient monitoring Recommend outpatient diabetes education  UTI (urinary tract infection) UA indicative of UTI Urine culture positive for E. Coli, pansensitive; BCID + for E coli, likely urinary source Treated with Rocephin -> Cephalexin through 8/29  Ureteral calculus, left History of ureteral calculi Continue tamsulosin      Consultants: None Procedures performed: None  Disposition: Home Diet recommendation:  Regular diet DISCHARGE MEDICATION: Allergies as of 06/17/2023   No Known Allergies      Medication List     TAKE these medications    cephALEXin 500 MG capsule Commonly known as: KEFLEX Take 2 capsules (1,000 mg total) by  mouth 4 (four) times daily for 7 days. Start taking on: June 18, 2023   meclizine 25 MG tablet Commonly known as: ANTIVERT Take 25 mg by mouth daily.   multivitamin with minerals Tabs tablet Take 1 tablet by mouth daily.   tamsulosin 0.4 MG Caps capsule Commonly known as: Flomax Take 1 capsule (0.4 mg total) by mouth daily.        Consultants: None   Procedures: None   Antibiotics: Ceftriaxone 8/18-8/21 -> cefadroxil 1 gram BID 8/22-28   30 Day Unplanned Readmission Risk Score    Flowsheet Row ED to Hosp-Admission (Current) from 06/14/2023 in [facility] SURGICAL UNIT  30 Day Unplanned Readmission Risk Score (%) 10 Filed at 06/17/2023 0400       This score is the patient's risk of an unplanned readmission within 30 days of being discharged (0 -100%). The score is based on dignosis, age, lab data, medications, orders, and past utilization.   Low:  0-14.9   Medium: 15-21.9   High: 22-29.9   Extreme: 30 and above           Subjective: Feeling well today and ready to go home.  Sitting up in bedside chair, dressed.   Objective: Vitals:   06/16/23 2119 06/17/23 0409  BP: 116/75 125/74  Pulse: 93 90  Resp:    Temp: 97.9 F (36.6 C) 98.5 F (36.9 C)  SpO2: 99% 100%    Intake/Output Summary (Last 24 hours) at 06/17/2023 1051 Last data filed at 06/17/2023 0932 Gross per 24 hour  Intake 820 ml  Output --  Net 820 ml   Filed Weights   06/14/23 1759  Weight: 59 kg    Exam:  General:  Appears  calm and comfortable and is in NAD Eyes:   EOMI, normal lids, iris ENT:  grossly normal hearing, lips & tongue, mmm Neck:  no LAD, masses or thyromegaly Cardiovascular:  RRR, no m/r/g. No LE edema.  Respiratory:   CTA bilaterally with no wheezes/rales/rhonchi.  Normal respiratory effort. Abdomen:  soft, NT, ND Back:   normal alignment, no CVAT Skin:  no rash or induration seen on limited exam Musculoskeletal:  grossly normal tone BUE/BLE, good ROM, no  bony abnormality Psychiatric: blunted mood and affect, speech fluent and appropriate, AOx3 Neurologic:  CN 2-12 grossly intact, moves all extremities in coordinated fashion   Data Reviewed: I have reviewed the patient's lab results since admission.  Pertinent labs for today include:   Glucose 110 Procalcitonin 13.43, down from 19.23 WBC 13.9, down from 16.3 Hgb 9 Platelets 131    Condition at discharge: good  The results of significant diagnostics from this hospitalization (including imaging, microbiology, ancillary and laboratory) are listed below for reference.   Imaging Studies: No results found.  Microbiology: Results for orders placed or performed during the hospital encounter of 06/14/23  Urine Culture     Status: Abnormal   Collection Time: 06/14/23  6:09 PM   Specimen: Urine, Clean Catch  Result Value Ref Range Status   Specimen Description   Final    URINE, CLEAN CATCH Performed at [facility], [address], [place], [place] [address]    Special Requests   Final    NONE Performed at [facility], [address] [place], [place] [address]    Culture >=100,000 COLONIES/mL ESCHERICHIA COLI (A)  Final   Report Status 06/17/2023 FINAL  Final   Organism ID, Bacteria ESCHERICHIA COLI (A)  Final      Susceptibility   Escherichia coli - MIC*    AMPICILLIN <=2 SENSITIVE Sensitive     CEFAZOLIN <=4 SENSITIVE Sensitive     CEFEPIME <=0.12 SENSITIVE Sensitive     CEFTRIAXONE <=0.25 SENSITIVE Sensitive     CIPROFLOXACIN <=0.25 SENSITIVE Sensitive     GENTAMICIN <=1 SENSITIVE Sensitive     IMIPENEM <=0.25 SENSITIVE Sensitive     NITROFURANTOIN <=16 SENSITIVE Sensitive     TRIMETH/SULFA <=20 SENSITIVE Sensitive     AMPICILLIN/SULBACTAM <=2 SENSITIVE Sensitive     PIP/TAZO <=4 SENSITIVE Sensitive     * >=100,000 COLONIES/mL ESCHERICHIA COLI  Blood culture (routine x 2)     Status: Abnormal   Collection Time: 06/14/23  7:20 PM   Specimen: Left Antecubital; Blood   Result Value Ref Range Status   Specimen Description   Final    LEFT ANTECUBITAL BLOOD Performed at [facility], [address], [place], [place] [address]    Special Requests   Final    BOTTLES DRAWN AEROBIC AND ANAEROBIC Blood Culture adequate volume Performed at [facility], [address] [place], [place] [address]    Culture  Setup Time   Final    GRAM NEGATIVE RODS ANAEROBIC BOTTLE ONLY Gram Stain Report Called to,Read Back By and Verified With: C BLACKWELL AT 1003 ON [id number] BY S D[place]TON CRITICAL RESULT CALLED TO, READ BACK BY AND VERIFIED WITH: [facility] WILL ANDERSON 1321 [id number] FCP Performed at [facility], [address], [place], [place] [address]    Culture ESCHERICHIA COLI (A)  Final   Report Status 06/17/2023 FINAL  Final   Organism ID, Bacteria ESCHERICHIA COLI  Final      Susceptibility   Escherichia coli - MIC*  AMPICILLIN <=2 SENSITIVE Sensitive     CEFEPIME <=0.12 SENSITIVE Sensitive     CEFTAZIDIME <=1 SENSITIVE Sensitive     CEFTRIAXONE <=0.25 SENSITIVE Sensitive     CIPROFLOXACIN <=0.25 SENSITIVE Sensitive     GENTAMICIN <=1 SENSITIVE Sensitive     IMIPENEM <=0.25 SENSITIVE Sensitive     TRIMETH/SULFA <=20 SENSITIVE Sensitive     AMPICILLIN/SULBACTAM <=2 SENSITIVE Sensitive     PIP/TAZO <=4 SENSITIVE Sensitive     * ESCHERICHIA COLI  Blood Culture ID Panel (Reflexed)     Status: Abnormal   Collection Time: 06/14/23  7:20 PM  Result Value Ref Range Status   Enterococcus faecalis NOT DETECTED NOT DETECTED Final   Enterococcus Faecium NOT DETECTED NOT DETECTED Final   Listeria monocytogenes NOT DETECTED NOT DETECTED Final   Staphylococcus species NOT DETECTED NOT DETECTED Final   Staphylococcus aureus (BCID) NOT DETECTED NOT DETECTED Final   Staphylococcus epidermidis NOT DETECTED NOT DETECTED Final   Staphylococcus lugdunensis NOT DETECTED NOT DETECTED Final   Streptococcus species NOT DETECTED NOT DETECTED Final   Streptococcus agalactiae NOT  DETECTED NOT DETECTED Final   Streptococcus pneumoniae NOT DETECTED NOT DETECTED Final   Streptococcus pyogenes NOT DETECTED NOT DETECTED Final   A.calcoaceticus-baumannii NOT DETECTED NOT DETECTED Final   Bacteroides fragilis NOT DETECTED NOT DETECTED Final   Enterobacterales DETECTED (A) NOT DETECTED Final    Comment: Enterobacterales represent a large order of gram negative bacteria, not a single organism. CRITICAL RESULT CALLED TO, READ BACK BY AND VERIFIED WITH: PHARMD WILL ANDERSON 1321 [id number] FCP    Enterobacter cloacae complex NOT DETECTED NOT DETECTED Final   Escherichia coli DETECTED (A) NOT DETECTED Final    Comment: CRITICAL RESULT CALLED TO, READ BACK BY AND VERIFIED WITH: PHARMD WILL ANDERSON [phone] FCP    Klebsiella aerogenes NOT DETECTED NOT DETECTED Final   Klebsiella oxytoca NOT DETECTED NOT DETECTED Final   Klebsiella pneumoniae NOT DETECTED NOT DETECTED Final   Proteus species NOT DETECTED NOT DETECTED Final   Salmonella species NOT DETECTED NOT DETECTED Final   Serratia marcescens NOT DETECTED NOT DETECTED Final   Haemophilus influenzae NOT DETECTED NOT DETECTED Final   Neisseria meningitidis NOT DETECTED NOT DETECTED Final   Pseudomonas aeruginosa NOT DETECTED NOT DETECTED Final   Stenotrophomonas maltophilia NOT DETECTED NOT DETECTED Final   Candida albicans NOT DETECTED NOT DETECTED Final   Candida auris NOT DETECTED NOT DETECTED Final   Candida glabrata NOT DETECTED NOT DETECTED Final   Candida krusei NOT DETECTED NOT DETECTED Final   Candida parapsilosis NOT DETECTED NOT DETECTED Final   Candida tropicalis NOT DETECTED NOT DETECTED Final   Cryptococcus neoformans/gattii NOT DETECTED NOT DETECTED Final   CTX-M ESBL NOT DETECTED NOT DETECTED Final   Carbapenem resistance IMP NOT DETECTED NOT DETECTED Final   Carbapenem resistance KPC NOT DETECTED NOT DETECTED Final   Carbapenem resistance NDM NOT DETECTED NOT DETECTED Final   Carbapenem resist OXA 48  LIKE NOT DETECTED NOT DETECTED Final   Carbapenem resistance VIM NOT DETECTED NOT DETECTED Final    Comment: Performed at [facility], [address] [place], [place] [address]  Blood culture (routine x 2)     Status: Abnormal   Collection Time: 06/14/23  7:26 PM   Specimen: BLOOD RIGHT FOREARM  Result Value Ref Range Status   Specimen Description   Final    BLOOD RIGHT FOREARM BLOOD Performed at [facility], [address], [place], [place] [address]  Special Requests   Final    BOTTLES DRAWN AEROBIC AND ANAEROBIC Blood Culture results may not be optimal due to an excessive volume of blood received in culture bottles Performed at [facility], [address], [place], [place] [address]    Culture  Setup Time   Final    GRAM NEGATIVE RODS IN BOTH AEROBIC AND ANAEROBIC BOTTLES Gram Stain Report Called to,Read Back By and Verified With: C BLACKWELL AT 1003 ON [id number] BY S DALTON Performed at [facility], [address], [place], [place] [address]    Culture (A)  Final    ESCHERICHIA COLI SUSCEPTIBILITIES PERFORMED ON PREVIOUS CULTURE WITHIN THE LAST 5 DAYS. Performed at [facility], [address], [place], [place] [address]    Report Status 06/17/2023 FINAL  Final  Resp panel by RT-PCR (RSV, Flu A&B, Covid) Anterior Nasal Swab     Status: None   Collection Time: 06/14/23  8:02 PM   Specimen: Anterior Nasal Swab  Result Value Ref Range Status   SARS Coronavirus 2 by RT PCR NEGATIVE NEGATIVE Final    Comment: (NOTE) SARS-CoV-2 target nucleic acids are NOT DETECTED.  The SARS-CoV-2 RNA is generally detectable in upper respiratory specimens during the acute phase of infection. The lowest concentration of SARS-CoV-2 viral copies this assay can detect is 138 copies/mL. A negative result does not preclude SARS-Cov-2 infection and should not be used as the sole basis for treatment or other patient management decisions. A negative result may occur with  improper  specimen collection/handling, submission of specimen other than nasopharyngeal swab, presence of viral mutation(s) within the areas targeted by this assay, and inadequate number of viral copies(<138 copies/mL). A negative result must be combined with clinical observations, patient history, and epidemiological information. The expected result is Negative.  Fact Sheet for Patients:  BloggerCourse.com  Fact Sheet for Healthcare Providers:  SeriousBroker.it  This test is no t yet approved or cleared by the [place] FDA and  has been authorized for detection and/or diagnosis of SARS-CoV-2 by FDA under an Emergency Use Authorization (EUA). This EUA will remain  in effect (meaning this test can be used) for the duration of the COVID-19 declaration under Section 564(b)(1) of the Act, 21 U.S.C.section 360bbb-3(b)(1), unless the authorization is terminated  or revoked sooner.       Influenza A by PCR NEGATIVE NEGATIVE Final   Influenza B by PCR NEGATIVE NEGATIVE Final    Comment: (NOTE) The Xpert Xpress SARS-CoV-2/FLU/RSV plus assay is intended as an aid in the diagnosis of influenza from Nasopharyngeal swab specimens and should not be used as a sole basis for treatment. Nasal washings and aspirates are unacceptable for Xpert Xpress SARS-CoV-2/FLU/RSV testing.  Fact Sheet for Patients: BloggerCourse.com  Fact Sheet for Healthcare Providers: SeriousBroker.it  This test is not yet approved or cleared by the [place] FDA and has been authorized for detection and/or diagnosis of SARS-CoV-2 by FDA under an Emergency Use Authorization (EUA). This EUA will remain in effect (meaning this test can be used) for the duration of the COVID-19 declaration under Section 564(b)(1) of the Act, 21 U.S.C. section 360bbb-3(b)(1), unless the authorization is terminated or revoked.     Resp  Syncytial Virus by PCR NEGATIVE NEGATIVE Final    Comment: (NOTE) Fact Sheet for Patients: BloggerCourse.com  Fact Sheet for Healthcare Providers: SeriousBroker.it  This test is not yet approved or cleared by the [place] FDA and has been authorized for detection and/or diagnosis of SARS-CoV-2  by FDA under an Emergency Use Authorization (EUA). This EUA will remain in effect (meaning this test can be used) for the duration of the COVID-19 declaration under Section 564(b)(1) of the Act, 21 U.S.C. section 360bbb-3(b)(1), unless the authorization is terminated or revoked.  Performed at [facility], [address] [place], [place] [address]     Labs: CBC: Recent Labs  Lab 06/14/23 1816 06/15/23 0601 06/16/23 0800 06/17/23 0427  WBC 18.9* 15.2* 16.3* 13.9*  NEUTROABS 16.8* 14.0* 14.8* 12.0*  HGB 12.7* 11.2* 9.0* 9.0*  HCT 40.3 35.7* 28.2* 28.3*  MCV 78.7* 78.8* 77.7* 77.7*  PLT 209 163 116* 131*   Basic Metabolic Panel: Recent Labs  Lab 06/14/23 1816 06/15/23 0601 06/16/23 0800 06/17/23 0427  NA 137 137 135 137  K 4.2 3.8 3.3* 3.7  CL 103 104 104 107  CO2 24 24 24 23   GLUCOSE 136* 102* 100* 110*  BUN 21 20 21 22   CREATININE 1.21 1.22 1.19 1.04  CALCIUM 9.1 8.6* 8.0* 8.3*  MG  --  1.6*  --   --    Liver Function Tests: Recent Labs  Lab 06/15/23 0601  AST 23  ALT 15  ALKPHOS 59  BILITOT 1.0  PROT 6.8  ALBUMIN 3.4*   CBG: Recent Labs  Lab 06/16/23 0436 06/16/23 0706 06/16/23 1113 06/16/23 1535 06/17/23 0900  GLUCAP 97 97 112* 115* 97    Discharge time spent: greater than 30 minutes.  Signed: Jonah Blue, MD [facility] 06/17/2023

## 2023-06-17 NOTE — Care Management Important Message (Signed)
Important Message  Patient Details  Name: Sigmund Minniefield MRN: [id number] Date of Birth: 08/15/1951   Medicare Important Message Given:  N/A - LOS <3 / Initial given by admissions     Corey Harold 06/17/2023, 11:17 AM

## 2023-06-17 NOTE — Plan of Care (Signed)
  Problem: Fluid Volume: Goal: Ability to maintain a balanced intake and output will improve Outcome: Adequate for Discharge   Problem: Health Behavior/Discharge Planning: Goal: Ability to identify and utilize available resources and services will improve Outcome: Adequate for Discharge   Problem: Health Behavior/Discharge Planning: Goal: Ability to manage health-related needs will improve Outcome: Adequate for Discharge   Problem: Nutritional: Goal: Maintenance of adequate nutrition will improve Outcome: Adequate for Discharge
